# Patient Record
Sex: Female | Born: 2000 | Race: White | Hispanic: No | Marital: Single | Smoking: Never smoker
Health system: Southern US, Community
[De-identification: ages and names within clinical notes are randomized; demographics above are authoritative.]

## PROBLEM LIST (undated history)

## (undated) DIAGNOSIS — T7840XA Allergy, unspecified, initial encounter: Secondary | ICD-10-CM

## (undated) DIAGNOSIS — F988 Other specified behavioral and emotional disorders with onset usually occurring in childhood and adolescence: Secondary | ICD-10-CM

## (undated) HISTORY — DX: Other specified behavioral and emotional disorders with onset usually occurring in childhood and adolescence: F98.8

## (undated) HISTORY — PX: TYMPANOSTOMY TUBE PLACEMENT: SHX32

## (undated) HISTORY — PX: TONSILLECTOMY AND ADENOIDECTOMY: SUR1326

## (undated) HISTORY — DX: Allergy, unspecified, initial encounter: T78.40XA

## (undated) HISTORY — PX: ADENOIDECTOMY: SUR15

---
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
## 2019-02-13 ENCOUNTER — Telehealth: Payer: Self-pay | Admitting: Psychiatry

## 2019-02-13 DIAGNOSIS — F340 Cyclothymic disorder: Secondary | ICD-10-CM

## 2019-02-13 NOTE — Telephone Encounter (Signed)
Patient called and would like you to call her back. She has a question about going up on her lanictal? She will be available after tomorrow after 11 am to discuss this matter. Please call at [phone]

## 2019-02-14 MED ORDER — LAMOTRIGINE 100 MG PO TABS: 100.0000 mg | ORAL_TABLET | Freq: Every day | ORAL | 0 refills | Status: DC

## 2019-02-14 NOTE — Addendum Note (Signed)
Addended by: Milana Huntsman E on: 02/14/2019 11:57 AM   Modules accepted: Orders

## 2019-02-14 NOTE — Telephone Encounter (Signed)
Phone review with patient who has discussed with mother current status of mood, executive function, and anxiety concluding all improved in her daily life in [place] but needing to complete the titration of Lamictal now as has been anticipated including based in brother's course of response to treatment.  Lamictal 100 mg every bedtime as best for her #90 with no refill is sent to [facility] [address] #90 with no refill due for appointment early 2021 January or February.  She appears to still take the Wellbutrin but does not definitely clarify for Effexor or Dextrostat currently.

## 2019-02-21 ENCOUNTER — Other Ambulatory Visit: Payer: Self-pay | Admitting: Psychiatry

## 2019-02-21 DIAGNOSIS — F902 Attention-deficit hyperactivity disorder, combined type: Secondary | ICD-10-CM

## 2019-02-21 DIAGNOSIS — F340 Cyclothymic disorder: Secondary | ICD-10-CM

## 2019-02-21 DIAGNOSIS — F411 Generalized anxiety disorder: Secondary | ICD-10-CM

## 2019-03-07 ENCOUNTER — Other Ambulatory Visit: Payer: Self-pay | Admitting: Psychiatry

## 2019-03-07 DIAGNOSIS — F340 Cyclothymic disorder: Secondary | ICD-10-CM

## 2019-04-23 ENCOUNTER — Other Ambulatory Visit: Payer: Self-pay | Admitting: Psychiatry

## 2019-04-23 DIAGNOSIS — F902 Attention-deficit hyperactivity disorder, combined type: Secondary | ICD-10-CM

## 2019-04-23 DIAGNOSIS — F411 Generalized anxiety disorder: Secondary | ICD-10-CM

## 2019-04-23 DIAGNOSIS — F340 Cyclothymic disorder: Secondary | ICD-10-CM

## 2019-05-27 ENCOUNTER — Other Ambulatory Visit: Payer: Self-pay | Admitting: Psychiatry

## 2019-05-27 DIAGNOSIS — F902 Attention-deficit hyperactivity disorder, combined type: Secondary | ICD-10-CM

## 2019-05-28 ENCOUNTER — Other Ambulatory Visit: Payer: Self-pay | Admitting: Psychiatry

## 2019-05-28 DIAGNOSIS — F902 Attention-deficit hyperactivity disorder, combined type: Secondary | ICD-10-CM

## 2019-05-28 DIAGNOSIS — F411 Generalized anxiety disorder: Secondary | ICD-10-CM

## 2019-05-28 DIAGNOSIS — F340 Cyclothymic disorder: Secondary | ICD-10-CM

## 2019-05-28 NOTE — Telephone Encounter (Signed)
Last apt 12/2018 nothing scheduled

## 2019-05-28 NOTE — Telephone Encounter (Signed)
Last seen 12/06/2018 sending 1 additional Dextrostat 5 mg IR twice daily #60 with no refill to [facility] reminding that office appointment due.

## 2019-07-29 ENCOUNTER — Other Ambulatory Visit: Payer: Self-pay

## 2019-07-29 DIAGNOSIS — F340 Cyclothymic disorder: Secondary | ICD-10-CM

## 2019-07-29 MED ORDER — LAMOTRIGINE 100 MG PO TABS: ORAL_TABLET | ORAL | 0 refills | Status: DC

## 2019-08-07 ENCOUNTER — Ambulatory Visit: Payer: BC Managed Care – PPO | Admitting: Psychiatry

## 2019-08-12 ENCOUNTER — Ambulatory Visit (INDEPENDENT_AMBULATORY_CARE_PROVIDER_SITE_OTHER): Payer: BC Managed Care – PPO | Admitting: Psychiatry

## 2019-08-12 ENCOUNTER — Encounter: Payer: Self-pay | Admitting: Psychiatry

## 2019-08-12 ENCOUNTER — Other Ambulatory Visit: Payer: Self-pay

## 2019-08-12 VITALS — Ht 65.0 in | Wt 130.0 lb

## 2019-08-12 DIAGNOSIS — F809 Developmental disorder of speech and language, unspecified: Secondary | ICD-10-CM | POA: Diagnosis not present

## 2019-08-12 DIAGNOSIS — F902 Attention-deficit hyperactivity disorder, combined type: Secondary | ICD-10-CM | POA: Diagnosis not present

## 2019-08-12 DIAGNOSIS — F340 Cyclothymic disorder: Secondary | ICD-10-CM | POA: Diagnosis not present

## 2019-08-12 DIAGNOSIS — F411 Generalized anxiety disorder: Secondary | ICD-10-CM

## 2019-08-12 MED ORDER — ESCITALOPRAM OXALATE 5 MG PO TABS: 5.0000 mg | ORAL_TABLET | Freq: Every day | ORAL | 2 refills | Status: DC

## 2019-08-12 MED ORDER — DEXTROAMPHETAMINE SULFATE 5 MG PO TABS: 5.0000 mg | ORAL_TABLET | Freq: Two times a day (BID) | ORAL | 0 refills | Status: DC

## 2019-08-12 MED ORDER — LAMOTRIGINE 150 MG PO TABS: 150.0000 mg | ORAL_TABLET | Freq: Every day | ORAL | 2 refills | Status: DC

## 2019-08-12 MED ORDER — BUPROPION HCL ER (SR) 150 MG PO TB12: 150.0000 mg | ORAL_TABLET | Freq: Every day | ORAL | 0 refills | Status: DC

## 2019-08-12 NOTE — Progress Notes (Signed)
[facility]  Patient ID: Christy Hart,  MRN: [id number]  PCP: Doristine Bosworth, MD  Date of Evaluation: 08/12/2019 Time spent:25 minutes from 1140 to 1205  Chief Complaint:  Chief Complaint    ADHD; Anxiety; Depression; Agitation; Manic Behavior      HISTORY/CURRENT STATUS: Christy Hart is seen onsite in office 25 minutes conjointly with mother with consent with epic collateral for adolescent psychiatric interview and exam in 36-month evaluation and management being 2 months overdue for cyclothymia, generalized anxiety, and ADHD/developmental coordination disorder.  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  Patient does state that some days she does not want to leave the apartment.  Mother has been helping her with breathing techniques for anxiety, and the patient continues on line therapy session with Burney Gauze, PhD.  They have called in the interim to titrate up Lamictal rather than returning for follow-up so that Lamictal is now at 100 mg daily long with her Wellbutrin 150 mg XL daily and Dextrostat 5 mg IR twice daily.  The patient did stop Effexor though much more gradually than had been planned.  They seek additional treatment in place of the Effexor today for anxiety interested in Lexapro as the patient's brother takes that.  However at the same time they are concerned the patient's Lamictal is insufficient for mood irritability or instability.  She is preparing for school in August, so it is not possible for her to disengage from work and return home to prepare but rather seeks medication  management to potentially facilitate anxiety relief for her own self directed stabilization.  Brother had similar adaptive problems to college predominantly rooted in OCD though the patient does not acknowledge the same cognitive fixations and ritualized responses but rather generalized anxiety.  She is not manic, psychotic, delirious, or suicidal.   Depression         The patient presents withdepressionasa newproblem starting2 years with onset quality overall gradual occurring daily with labile apathetic negativity alternating with expansive social/task completion pursuit.The problem has been gradually waxing and waningsince onset.Associated symptoms include irritable agitation, constricting and expansive social projectiondecreased interest,s still on BCP, decreased concentration, concentration, decreased interest,social inhibition and worrisome doubt quickly worked through, and episodic sadness. Associated symptoms include no fatigue,no irritability,no myalgias,no headaches, no hopelessness, no self defeat or self-harm,and no suicidal ideas.The symptoms are aggravated by social issues, family issues and work stress.Past treatments include SNRI, other medications and psychotherapy.Compliance with treatment is variable and poor.Past compliance problems include medical issues, difficulty with treatment plan and medication issues.Previous treatment provided mildrelief.Risk factors include a change in medication usage/dosage, family history, family history of mental illness, history of mental illness, major life event, stress and prior traumatic experience. Past medical history includes anxiety,depressionand mental health disorder. Pertinent negatives include no life-threatening condition,no physical disability,no recent psychiatric admission,no bipolar disorder,no eating disorder,no obsessive-compulsive disorder,no post-traumatic stress disorder,no schizophrenia,no  suicide attemptsand no head trauma.  Individual Medical History/ Review of Systems: Changes? :Yes Weight is down 7 pounds in 8 months  registry documenting last dispensing of Dextrostat 05/28/2019  Allergies: Amoxicillin and Azithromycin  Current Medications:  Current Outpatient Medications:  .  buPROPion (WELLBUTRIN SR) 150 MG 12 hr tablet, Take 1 tablet (150 mg total) by mouth daily after breakfast., Disp: 90 tablet, Rfl: 0 .  dextroamphetamine (DEXTROSTAT) 5 MG tablet, Take 1 tablet (5 mg total) by mouth 2 (two) times daily with breakfast and lunch., Disp: 60 tablet, Rfl: 0 .  doxycycline (VIBRA-TABS) 100 MG tablet, Take 1 tablet (100 mg total) by mouth 2 (two) times daily., Disp: 20 tablet, Rfl: 0 .  drospirenone-ethinyl estradiol (YASMIN,ZARAH,SYEDA) 3-0.03 MG tablet, , Disp: , Rfl:  .  escitalopram (LEXAPRO) 5 MG tablet, Take 1 tablet (5 mg total) by mouth at bedtime., Disp: 30 tablet, Rfl: 2 .  fluticasone (FLONASE) 50 MCG/ACT nasal spray, Place 2 sprays into both nostrils daily., Disp: 16 g, Rfl: 12 .  lamoTRIgine (LAMICTAL) 150 MG tablet, Take 1 tablet (150 mg total) by mouth daily., Disp: 30 tablet, Rfl: 2  Medication Side Effects: none  Family Medical/ Social History: Changes? No  MENTAL HEALTH EXAM:  Height 5\' 5"  (1.651 m), weight 130 lb (59 kg).Body mass index is 21.63 kg/m. Muscle strengths and tone 5/5, postural reflexes and gait 0/0, and AIMS = 0 otherwise deferred for coronavirus shutdown  General Appearance: Casual, Fairly Groomed, Guarded and Meticulous  Eye Contact:  Fair  Speech:  Blocked, Clear and Coherent, Normal Rate and Talkative  Volume:  Normal  Mood:  Anxious, Dysphoric, Euphoric, Euthymic and Irritable  Affect:  Congruent, Depressed, Inappropriate, Labile, Full Range and Anxious  Thought Process:  Coherent, Goal Directed, Irrelevant, Linear and Descriptions of Associations: Tangential  Orientation:  Full (Time, Place, and Person)  Thought Content:  Ilusions, Rumination and Tangential   Suicidal Thoughts:  No  Homicidal Thoughts:  No  Memory:  Immediate;   Good Remote;   Fair  Judgement:  Impaired to fair  Insight:  Fair and Lacking  Psychomotor Activity:  Normal, Increased and Mannerisms  Concentration:  Concentration: Fair and Attention Span: Fair  Recall:  [employer] of Knowledge: Fair to good  Language: Good  Assets:  Intimacy Physical Health Resilience Talents/Skills  ADL's:  Intact  Cognition: WNL  Prognosis:  Fair    DIAGNOSES:    ICD-10-CM   1. Attention deficit hyperactivity disorder (ADHD), combined type, moderate  F90.2 buPROPion (WELLBUTRIN SR) 150 MG 12 hr tablet    dextroamphetamine (DEXTROSTAT) 5 MG tablet  2. 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  Patient and mother have helpless posture now in separation when in session today reconstructing the hostile dependence of prior to her move to [place] projecting that any discomfort will produce unchangeable consequences when the patient has been overcoming consequences in her work and likely soon her school to successfully reside on her own in [place].  Therefore the session must redirect to the coping skills that accomplished her moved to [place] likely in her therapy with Carney Bern more than from mother's help in which the patient becomes dependent and  needy.  By closure of session today, the patient is more competent and confident that she can meet the employment and school expectations for upcoming combination.  Lamictal is increased to 150 mg every morning sent as #30 with 2 refills to [facility] for cyclothymia as she requires it to be filled in [place] though she is going back to [place] and wishes her other prescriptions to be sent there.  She is E scribed Lexapro 5 mg every bedtime sent as #30 with [facility] [address] [place] on [address] for generalized anxiety.  She is E scribed Wellbutrin 150 mg SR tablet every morning after breakfast sent as #30 with [facility]ills [facility] [address] in [place] for cyclothymia, ADHD and generalized anxiety.  She is E scribed Dextrostat 5 mg IR twice daily breakfast and lunch sent as #60 with no refill to [facility] [address] [place] [facility] [address] for ADHD.  She returns for follow-up in 3 to 6 months advising 3 months but she refuses to nodule that is less necessary otherwise 6 months   Chauncey Mann, MD

## 2019-09-07 ENCOUNTER — Other Ambulatory Visit: Payer: Self-pay | Admitting: Psychiatry

## 2019-09-07 DIAGNOSIS — F340 Cyclothymic disorder: Secondary | ICD-10-CM

## 2019-09-07 DIAGNOSIS — F411 Generalized anxiety disorder: Secondary | ICD-10-CM

## 2019-10-28 ENCOUNTER — Other Ambulatory Visit: Payer: Self-pay

## 2019-10-28 ENCOUNTER — Telehealth: Payer: Self-pay | Admitting: Psychiatry

## 2019-10-28 DIAGNOSIS — F340 Cyclothymic disorder: Secondary | ICD-10-CM

## 2019-10-28 MED ORDER — LAMOTRIGINE 150 MG PO TABS: 150.0000 mg | ORAL_TABLET | Freq: Every day | ORAL | 2 refills | Status: DC

## 2019-10-28 NOTE — Telephone Encounter (Signed)
Pt needs Lamictal 150mg  [facility] [address] [facility] [place].  Appt 8/18

## 2019-10-28 NOTE — Telephone Encounter (Signed)
Rx refill submitted to [facility] [place].

## 2019-12-18 ENCOUNTER — Ambulatory Visit: Payer: BC Managed Care – PPO | Admitting: Psychiatry

## 2019-12-19 ENCOUNTER — Other Ambulatory Visit: Payer: Self-pay | Admitting: Psychiatry

## 2019-12-19 ENCOUNTER — Telehealth (INDEPENDENT_AMBULATORY_CARE_PROVIDER_SITE_OTHER): Payer: BC Managed Care – PPO | Admitting: Psychiatry

## 2019-12-19 ENCOUNTER — Encounter: Payer: Self-pay | Admitting: Psychiatry

## 2019-12-19 ENCOUNTER — Telehealth: Payer: Self-pay | Admitting: Psychiatry

## 2019-12-19 DIAGNOSIS — F902 Attention-deficit hyperactivity disorder, combined type: Secondary | ICD-10-CM | POA: Diagnosis not present

## 2019-12-19 DIAGNOSIS — F809 Developmental disorder of speech and language, unspecified: Secondary | ICD-10-CM

## 2019-12-19 DIAGNOSIS — F411 Generalized anxiety disorder: Secondary | ICD-10-CM

## 2019-12-19 DIAGNOSIS — F340 Cyclothymic disorder: Secondary | ICD-10-CM | POA: Diagnosis not present

## 2019-12-19 MED ORDER — LAMOTRIGINE 150 MG PO TABS: 150.0000 mg | ORAL_TABLET | Freq: Every day | ORAL | 5 refills | Status: DC

## 2019-12-19 MED ORDER — BUPROPION HCL ER (SR) 150 MG PO TB12: 150.0000 mg | ORAL_TABLET | Freq: Every day | ORAL | 5 refills | Status: DC

## 2019-12-19 NOTE — Progress Notes (Signed)
[facility]  Patient ID: Christy Hart,  MRN: [id number]  PCP: Doristine Bosworth, MD  Date of Evaluation: 12/19/2019 Time spent:20 minutes 1345 to 1405  Chief Complaint:  Chief Complaint    ADHD; Anxiety; Depression; Manic Behavior      HISTORY/CURRENT STATUS: Christy Hart is provided telemedicine audiovisual appointment session though she declines video camera due to generalized anxiety, 20 minutes phone to phone individually on virtual office visit with telehealth consent fully documented with epic collateral for psychiatric interview and exam in 58-month evaluation and management being 1 month overdue for ADHD, generalized anxiety disorder, cyclothymia, and developmental communication disorder of auditory processing.  The patient's phone number was busy for this appointment as she reports calling [employer] for flat tire and then called here to start the session as I continued to try to reach her. The patient did not return for follow-up before leaving for [employer] to start next Monday in [place] establishing her residence there fully.  She did not continue the Lexapro 5 mg from last appointment for increased anxiety nor did she assess any impact upon increased irritability for which she thought she might need increase Lamictal, both now adequate with previous medication and therapy.  Mother was placing most of their medication pursuit upon the course of older brother now finishing college.  [place] registry documents only Dextrostat dispensing being 3 days after last appointment 08/15/2019 taking none in the summer patient now stating she has more than adequate supply and declines any further  Dextrostat escriptions, possibly implying Dextrostat contributes to the complaints last appointment of irritability and anxiety.  She has no mania, suicidality, psychosis or delirium.   Depression The patient presents withcyclic depressionstartingover 2 years with onset quality  gradualoccurringdaily with labile apathetic negativity alternating with expansive social/task completion pursuit.The problem has been gradually waxing and waningsince onset.Associated symptoms include irritable agitation, constricting and expansive social projection, decreased interest,decreased concentration,decreased interest,social inhibition, excessive worrisome doubt quickly worked through, andepisodicsadness. Associated symptoms include no fatigue,no irritability,no myalgias,no headaches, nohopelessness, no self defeat or self-harm,and no suicidal ideas.The symptoms are aggravated by social issues, family issues, medications, and work stress.Past treatments includeSNRI,other medications and psychotherapy.Compliance with treatment is variable and poor.Past compliance problems include medical issues, difficulty with treatment plan and medication issues.Previous treatment provided mildrelief.Risk factors include a change in medication usage/dosage, family history, family history of mental illness, history of mental illness, major life event, stress and prior traumatic experience. Past medical history includes anxiety,depressionand mental health disorder. Pertinent negatives include no life-threatening condition,no physical disability,no recent psychiatric admission,no bipolar disorder,no eating disorder,no obsessive-compulsive disorder,no post-traumatic stress disorder,no schizophrenia,no suicide attemptsand no head trauma.  Individual Medical History/ Review of Systems: Changes? :No   Allergies: Amoxicillin and Azithromycin  Current Medications:  Current Outpatient Medications:  .  buPROPion (WELLBUTRIN SR) 150 MG 12 hr tablet, Take 1 tablet (150 mg total) by mouth daily after breakfast., Disp: 30 tablet, Rfl: 5 .  dextroamphetamine (DEXTROSTAT) 5 MG tablet, Take 1 tablet (5 mg total) by mouth 2 (two) times daily with breakfast and lunch., Disp: 60 tablet,  Rfl: 0 .  doxycycline (VIBRA-TABS) 100 MG tablet, Take 1 tablet (100 mg total) by mouth 2 (two) times daily., Disp: 20 tablet, Rfl: 0 .  drospirenone-ethinyl estradiol (YASMIN,ZARAH,SYEDA) 3-0.03 MG tablet, , Disp: , Rfl:  .  fluticasone (FLONASE) 50 MCG/ACT nasal spray, Place 2 sprays into both nostrils daily., Disp: 16 g, Rfl: 12 .  lamoTRIgine (LAMICTAL) 150 MG tablet, Take 1 tablet (150 mg  total) by mouth daily., Disp: 30 tablet, Rfl: 5  Medication Side Effects: none  Family Medical/ Social History: Changes? No  MENTAL HEALTH EXAM:  There were no vitals taken for this visit.There is no height or weight on file to calculate BMI. as not present here today  General Appearance: NA  Eye Contact:  NA  Speech:  Clear and Coherent, Normal Rate and Talkative  Volume:  Normal  Mood:  Anxious and Euthymic  Affect:  Congruent, Inappropriate, Labile and Anxious  Thought Process:  Coherent, Goal Directed, Irrelevant, Linear and Descriptions of Associations: Tangential  Orientation:  Full (Time, Place, and Person)  Thought Content: Rumination and Tangential   Suicidal Thoughts:  No  Homicidal Thoughts:  No  Memory:  Immediate;   Good Remote;   Fair  Judgement:  Fair  Insight:  Lacking  Psychomotor Activity:  NA  Concentration:  Concentration: Fair and Attention Span: Fair  Recall:  [employer] of Knowledge: Good  Language: Fair  Assets:  Intimacy Physical Health Resilience Social Support Talents/Skills  ADL's:  Intact  Cognition: WNL  Prognosis:  Fair    DIAGNOSES:    ICD-10-CM   1. Attention deficit hyperactivity disorder (ADHD), combined type, moderate  F90.2 buPROPion (WELLBUTRIN SR) 150 MG 12 hr tablet  2. 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 She plans Dextrostat 5 mg IR twice daily breakfast and lunch as needed though likely to be taken only for big projects and tests as she may have some irritability and anxiety  symptoms from the product which does help her ADHD, so she declines any eScription now stating she has more than adequate supply but can take the low-dose without problem when her other problems are most stable. Wellbutrin 150 mg SR every morning sent as #30 with [facility] [address] [facility] in [place] for cyclothymia, ADHD and generalized anxiety.  She is E scribed Lamictal 150 mg tablet every morning sent as #30 with [facility] [address] in [place] for cyclothymia.  She plans follow-up in 6 months or sooner if needed.   Virtual Visit via Telephone Note  I connected with Cheyenne Adas on 12/19/19 at  1:40 PM EDT by telephone and verified that I am speaking with the correct person using two identifiers.  Location: Patient:  phone to phone individually with privacy at [facility] residence Provider: [facility]   I discussed the limitations, risks, security and privacy concerns of performing an evaluation and management service by telephone and the availability of in person appointments. I also discussed with the patient that there may be a patient responsible charge related to this service. The patient expressed understanding and agreed to proceed.   History of Present Illness: 72-month evaluation and management being 1 month overdue address ADHD, generalized anxiety disorder, cyclothymia, and developmental communication disorder of auditory processing.     Observations/Objective: Mood:  Anxious and Euthymic  Affect:  Congruent, Inappropriate, Labile and Anxious  Thought Process:  Coherent,  Goal Directed, Irrelevant, Linear and Descriptions of Associations: Tangential  Orientation:  Full (Time, Place, and Person)  Thought Content: Rumination and Tangential    Assessment and Plan: Psychosupportive psychoeducation gains opportunity though with limited consolidation by patient of symptom treatment matching and skills from Burney Gauze, PhD. She plans Dextrostat 5 mg IR twice daily breakfast and lunch as needed though likely to be taken only for big projects and tests as she may have some irritability and anxiety  symptoms from the product which does help her ADHD, so she declines any eScription now stating she has more than adequate supply but can take the low-dose without problem when her other problems are most stable. Wellbutrin 150 mg SR every morning sent as #30 with [facility] in [place] for cyclothymia, ADHD and generalized anxiety.  She is E scribed Lamictal 150 mg tablet every morning sent as #30 with [facility] [address] in [place] for cyclothymia.   Follow Up Instructions:  She plans follow-up in 6 months or sooner if needed so having sessions with Burney Gauze, PhD    I discussed the assessment and treatment plan with the patient. The patient was provided an opportunity to ask questions and all were answered. The patient agreed with the plan and demonstrated an understanding of the instructions.   The patient was advised to call back or seek an in-person evaluation if the symptoms worsen or if the condition fails to improve as anticipated.  I provided 20 minutes of non-face-to-face time during this encounter.   Chauncey Mann, MD  Chauncey Mann, MD

## 2019-12-19 NOTE — Telephone Encounter (Signed)
[name], ulloa are scheduled for a virtual visit with your provider today.    Just as we do with appointments in the office, we must obtain your consent to participate.  Your consent will be active for this visit and any virtual visit you may have with one of our providers in the next 365 days.    If you have a MyChart account, I can also send a copy of this consent to you electronically.  All virtual visits are billed to your insurance company just like a traditional visit in the office.  As this is a virtual visit, video technology does not allow for your provider to perform a traditional examination.  This may limit your provider's ability to fully assess your condition.  If your provider identifies any concerns that need to be evaluated in person or the need to arrange testing such as labs, EKG, etc, we will make arrangements to do so.    Although advances in technology are sophisticated, we cannot ensure that it will always work on either your end or our end.  If the connection with a video visit is poor, we may have to switch to a telephone visit.  With either a video or telephone visit, we are not always able to ensure that we have a secure connection.   I need to obtain your verbal consent now.   Are you willing to proceed with your visit today?   Christy Hart has provided verbal consent on 12/19/2019 for a virtual visit (video or telephone).   Chauncey Mann, MD 12/19/2019  1:59 PM

## 2020-01-22 ENCOUNTER — Other Ambulatory Visit: Payer: Self-pay

## 2020-01-22 ENCOUNTER — Ambulatory Visit (INDEPENDENT_AMBULATORY_CARE_PROVIDER_SITE_OTHER): Payer: BC Managed Care – PPO | Admitting: Psychiatry

## 2020-01-22 VITALS — Ht 65.0 in | Wt 132.0 lb

## 2020-01-22 DIAGNOSIS — F809 Developmental disorder of speech and language, unspecified: Secondary | ICD-10-CM

## 2020-01-22 DIAGNOSIS — F411 Generalized anxiety disorder: Secondary | ICD-10-CM

## 2020-01-22 DIAGNOSIS — F340 Cyclothymic disorder: Secondary | ICD-10-CM | POA: Diagnosis not present

## 2020-01-22 DIAGNOSIS — F902 Attention-deficit hyperactivity disorder, combined type: Secondary | ICD-10-CM | POA: Diagnosis not present

## 2020-01-22 DIAGNOSIS — F4311 Post-traumatic stress disorder, acute: Secondary | ICD-10-CM | POA: Diagnosis not present

## 2020-01-22 MED ORDER — HYDROXYZINE HCL 25 MG PO TABS: 25.0000 mg | ORAL_TABLET | Freq: Three times a day (TID) | ORAL | 0 refills | Status: DC | PRN
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
## 2020-01-26 ENCOUNTER — Encounter: Payer: Self-pay | Admitting: Psychiatry

## 2020-02-19 ENCOUNTER — Encounter: Payer: Self-pay | Admitting: Psychiatry

## 2020-03-12 ENCOUNTER — Other Ambulatory Visit: Payer: Self-pay | Admitting: Psychiatry

## 2020-03-12 DIAGNOSIS — F902 Attention-deficit hyperactivity disorder, combined type: Secondary | ICD-10-CM

## 2020-03-12 NOTE — Telephone Encounter (Signed)
Last refill 04/15 Last apt [facility] 01/2020

## 2020-03-13 ENCOUNTER — Other Ambulatory Visit: Payer: Self-pay | Admitting: Psychiatry

## 2020-03-13 DIAGNOSIS — F411 Generalized anxiety disorder: Secondary | ICD-10-CM

## 2020-03-13 DIAGNOSIS — F340 Cyclothymic disorder: Secondary | ICD-10-CM

## 2020-05-16 ENCOUNTER — Other Ambulatory Visit: Payer: Self-pay

## 2020-05-16 ENCOUNTER — Encounter (HOSPITAL_COMMUNITY): Payer: Self-pay

## 2020-05-16 ENCOUNTER — Ambulatory Visit (HOSPITAL_COMMUNITY)
Admission: EM | Admit: 2020-05-16 | Discharge: 2020-05-16 | Disposition: A | Discharge status: Home / Self Care | Payer: BC Managed Care – PPO | Attending: Family Medicine | Admitting: Family Medicine

## 2020-05-16 DIAGNOSIS — H9202 Otalgia, left ear: Secondary | ICD-10-CM

## 2020-05-16 DIAGNOSIS — H6982 Other specified disorders of Eustachian tube, left ear: Secondary | ICD-10-CM

## 2020-05-16 MED ORDER — PREDNISONE 20 MG PO TABS: 40.0000 mg | ORAL_TABLET | Freq: Every day | ORAL | 0 refills | Status: DC

## 2020-05-16 NOTE — ED Triage Notes (Signed)
Patient complains of left ear pain and pressure x 3 days. Pt is having throat pain and let side facial pain r/t the ear. Pt is aox4 and ambulatory.

## 2020-05-16 NOTE — ED Provider Notes (Signed)
[facility]   [id number] 05/16/20 Arrival Time: 1027  ASSESSMENT & PLAN:  1. Left ear pain   2. Dysfunction of left eustachian tube     No sign of ear infection. Likely viral illness with eustachian tube dysfunction.  OTC symptom care as needed.  Would like trial of: Meds ordered this encounter  Medications  . predniSONE (DELTASONE) 20 MG tablet    Sig: Take 2 tablets (40 mg total) by mouth daily.    Dispense:  10 tablet    Refill:  0     Follow-up Information    Doristine Bosworth, MD.   Specialty: Internal Medicine Why: If worsening or failing to improve as anticipated. Contact information: [address] [place] [address] [phone]               Reviewed expectations re: course of current medical issues. Questions answered. Outlined signs and symptoms indicating need for more acute intervention. Understanding verbalized. After Visit Summary given.   SUBJECTIVE: History from: patient. Christy Hart is a 20 y.o. female who reports mild ST and L ear pressure/pain; x 2-3 days; afebrile. Denies: cough and difficulty breathing. Normal PO intake without n/v/d.    OBJECTIVE:  Vitals:   05/16/20 1149 05/16/20 1151  BP:  108/78  Pulse: 86   Resp: 17   Temp:  97.8 F (36.6 C)  TempSrc: Oral Oral  SpO2: 99%     General appearance: alert; no distress Eyes: PERRLA; EOMI; conjunctiva normal HENT: [place]; AT; without nasal congestion; throat with mild cobblestoning; clear fluid behind normal appearing L TM Neck: supple  Lungs: speaks full sentences without difficulty; unlabored Extremities: no edema Skin: warm and dry Neurologic: normal gait Psychological: alert and cooperative; normal mood and affect   Allergies  Allergen Reactions  . Amoxicillin Other (See Comments)    [name], woozy  . Azithromycin     Causes the pt to turn grey in color and does not act her normal self,  woozy    Past Medical History:  Diagnosis Date  . ADD (attention deficit disorder)   . Allergy    Social History   Socioeconomic History  . Marital status: Single    Spouse name: n/a  . Number of children: 0  . Years of education: Not on file  . Highest education level: Not on file  Occupational History  . Occupation: Consulting civil engineer  Tobacco Use  . Smoking status: Never Smoker  . Smokeless tobacco: Never Used  Vaping Use  . Vaping Use: Never used  Substance and Sexual Activity  . Alcohol use: No  . Drug use: No  . Sexual activity: Yes  Other Topics Concern  . Not on file  Social History Narrative   Lives with both parents and an older brother in the same house.   Social Determinants of Health   Financial Resource Strain: Not on file  Food Insecurity: Not on file  Transportation Needs: Not on file  Physical Activity: Not on file  Stress: Not on file  Social Connections: Not on file  Intimate Partner Violence: Not on file   Family History  Problem Relation Age of Onset  . Hashimoto's thyroiditis Mother   . Stroke Maternal Grandmother 72       +TOBACCO (2 ppd smoker)   Past Surgical History:  Procedure Laterality Date  . ADENOIDECTOMY    . TONSILLECTOMY AND ADENOIDECTOMY    .  TYMPANOSTOMY TUBE PLACEMENT       Mardella Layman, MD 05/16/20 1252

## 2020-06-02 ENCOUNTER — Other Ambulatory Visit: Payer: Self-pay | Admitting: Psychiatry

## 2020-06-02 DIAGNOSIS — F411 Generalized anxiety disorder: Secondary | ICD-10-CM

## 2020-06-02 DIAGNOSIS — F340 Cyclothymic disorder: Secondary | ICD-10-CM

## 2020-06-02 DIAGNOSIS — F902 Attention-deficit hyperactivity disorder, combined type: Secondary | ICD-10-CM

## 2020-06-09 ENCOUNTER — Other Ambulatory Visit: Payer: Self-pay | Admitting: Psychiatry

## 2020-06-09 DIAGNOSIS — F340 Cyclothymic disorder: Secondary | ICD-10-CM

## 2020-06-10 NOTE — Telephone Encounter (Signed)
Patient needs scheduled with new provider

## 2020-06-16 ENCOUNTER — Ambulatory Visit: Payer: BC Managed Care – PPO | Admitting: Psychiatry

## 2020-06-16 NOTE — Telephone Encounter (Signed)
Has appt on 07/30/20

## 2020-07-14 ENCOUNTER — Other Ambulatory Visit: Payer: Self-pay | Admitting: Psychiatry

## 2020-07-14 DIAGNOSIS — F340 Cyclothymic disorder: Secondary | ICD-10-CM

## 2020-07-14 NOTE — Telephone Encounter (Signed)
Check on refill °

## 2020-07-30 ENCOUNTER — Ambulatory Visit: Payer: BC Managed Care – PPO | Admitting: Psychiatry

## 2020-08-11 ENCOUNTER — Ambulatory Visit: Payer: BC Managed Care – PPO | Admitting: Behavioral Health

## 2020-08-18 ENCOUNTER — Ambulatory Visit (INDEPENDENT_AMBULATORY_CARE_PROVIDER_SITE_OTHER): Payer: BC Managed Care – PPO | Admitting: Behavioral Health

## 2020-08-18 ENCOUNTER — Encounter: Payer: Self-pay | Admitting: Behavioral Health

## 2020-08-18 ENCOUNTER — Other Ambulatory Visit: Payer: Self-pay | Admitting: Psychiatry

## 2020-08-18 ENCOUNTER — Other Ambulatory Visit: Payer: Self-pay

## 2020-08-18 VITALS — BP 109/68 | HR 96 | Ht 65.0 in | Wt 143.0 lb

## 2020-08-18 DIAGNOSIS — F340 Cyclothymic disorder: Secondary | ICD-10-CM

## 2020-08-18 DIAGNOSIS — F902 Attention-deficit hyperactivity disorder, combined type: Secondary | ICD-10-CM | POA: Diagnosis not present

## 2020-08-18 DIAGNOSIS — F411 Generalized anxiety disorder: Secondary | ICD-10-CM | POA: Diagnosis not present

## 2020-08-18 MED ORDER — LAMOTRIGINE 150 MG PO TABS: 150.0000 mg | ORAL_TABLET | Freq: Every day | ORAL | 2 refills | Status: DC

## 2020-08-18 NOTE — Progress Notes (Signed)
[facility]  Patient ID: Christy Hart,  MRN: [id number]  PCP: Doristine Bosworth, MD  Date of Evaluation: 08/18/2020 Time spent:30 minutes  Chief Complaint:  Chief Complaint    Medication Refill; Follow-up      HISTORY/CURRENT STATUS: HPI  Individual Medical History/ Review of Systems: Changes? :No   Allergies: Amoxicillin and Azithromycin  Current Medications:  Current Outpatient Medications:  .  buPROPion (WELLBUTRIN SR) 150 MG 12 hr tablet, TAKE 1 TABLET BY MOUTH DAILY AFTER BREAKFAST., Disp: 30 tablet, Rfl: 0 .  dextroamphetamine (DEXTROSTAT) 5 MG tablet, TAKE 1 TABLET TWICE A DAY WITH BREAKFAST AND LUNCH., Disp: 60 tablet, Rfl: 0 .  drospirenone-ethinyl estradiol (YASMIN,ZARAH,SYEDA) 3-0.03 MG tablet, , Disp: , Rfl:  .  hydrOXYzine (ATARAX/VISTARIL) 25 MG tablet, Take 1 tablet (25 mg total) by mouth 3 (three) times daily as needed for anxiety or nausea (sleep)., Disp: 90 tablet, Rfl: 0 .  lamoTRIgine (LAMICTAL) 150 MG tablet, Take 1 tablet (150 mg total) by mouth daily., Disp: 30 tablet, Rfl: 2 Medication Side Effects: none  Family Medical/ Social History: Changes? No  MENTAL HEALTH EXAM:  Blood pressure 109/68, pulse 96, height 5\' 5"  (1.651 m), weight 143 lb (64.9 kg).Body mass index is 23.8 kg/m.  General Appearance: Casual, Neat and Well Groomed  Eye Contact:  Good  Speech:  Clear and Coherent  Volume:  Normal  Mood:  NA  Affect:  Appropriate  Thought Process:  Coherent  Orientation:  Full (Time, Place, and Person)  Thought Content: Logical   Suicidal Thoughts:  No  Homicidal Thoughts:  No  Memory:  WNL  Judgement:  Good  Insight:  Good  Psychomotor Activity:  Normal  Concentration:  Concentration: Good  Recall:  Good  Fund of Knowledge: Good  Language: Good  Assets:  Desire for Improvement  ADL's:  Intact  Cognition: WNL  Prognosis:  Good    DIAGNOSES:    ICD-10-CM   1. 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 Attention deficit hyperactivity disorder (ADHD), combined type  F90.2   3. Generalized anxiety disorder  F41.1     Receiving Psychotherapy: No    RECOMMENDATIONS:  Continue on current medication regimen Refilled Lamotrigine w/ two refills Reinforced effects of Lamotrigine and BC Will follow up in 3 months to reassess     , NP

## 2020-08-27 ENCOUNTER — Encounter: Payer: Self-pay | Admitting: Family Medicine

## 2020-08-27 ENCOUNTER — Other Ambulatory Visit: Payer: Self-pay

## 2020-08-27 ENCOUNTER — Ambulatory Visit: Payer: BC Managed Care – PPO | Admitting: Family Medicine

## 2020-08-27 VITALS — BP 118/72 | HR 95 | Temp 98.3°F | Resp 16 | Ht 65.0 in | Wt 143.8 lb

## 2020-08-27 DIAGNOSIS — S060X0A Concussion without loss of consciousness, initial encounter: Secondary | ICD-10-CM | POA: Diagnosis not present

## 2020-08-27 DIAGNOSIS — S39012A Strain of muscle, fascia and tendon of lower back, initial encounter: Secondary | ICD-10-CM | POA: Diagnosis not present

## 2020-08-27 NOTE — Patient Instructions (Addendum)
Make sure to eat with pain medicine, and I would recommend trying naproxen twice per day to see if less nausea. Ok to continue muscle relaxer for now.  Heat or ice and gentle range of motion may also be helpful for back pain.  Continue relative rest, try to avoid electronic media. As headache improves, ok to walk or other low intensity exercise for a few minutes at a time as tolerated. Follow-up in 1 week, sooner if any worsening or new symptoms.   Return to the clinic or go to the nearest emergency room if any of your symptoms worsen or new symptoms occur.   Acute Back Pain, Adult Acute back pain is sudden and usually short-lived. It is often caused by an injury to the muscles and tissues in the back. The injury may result from:  A muscle or ligament getting overstretched or torn (strained). Ligaments are tissues that connect bones to each other. Lifting something improperly can cause a back strain.  Wear and tear (degeneration) of the spinal disks. Spinal disks are circular tissue that provide cushioning between the bones of the spine (vertebrae).  Twisting motions, such as while playing sports or doing yard work.  A hit to the back.  Arthritis. You may have a physical exam, lab tests, and imaging tests to find the cause of your pain. Acute back pain usually goes away with rest and home care. Follow these instructions at home: Managing pain, stiffness, and swelling  Treatment may include medicines for pain and inflammation that are taken by mouth or applied to the skin, prescription pain medicine, or muscle relaxants. Take over-the-counter and prescription medicines only as told by your health care provider.  Your health care provider may recommend applying ice during the first 24-48 hours after your pain starts. To do this: ? Put ice in a plastic bag. ? Place a towel between your skin and the bag. ? Leave the ice on for 20 minutes, 2-3 times a day.  If directed, apply heat to the  affected area as often as told by your health care provider. Use the heat source that your health care provider recommends, such as a moist heat pack or a heating pad. ? Place a towel between your skin and the heat source. ? Leave the heat on for 20-30 minutes. ? Remove the heat if your skin turns bright red. This is especially important if you are unable to feel pain, heat, or cold. You have a greater risk of getting burned. Activity  Do not stay in bed. Staying in bed for more than 1-2 days can delay your recovery.  Sit up and stand up straight. Avoid leaning forward when you sit or hunching over when you stand. ? If you work at a desk, sit close to it so you do not need to lean over. Keep your chin tucked in. Keep your neck drawn back, and keep your elbows bent at a 90-degree angle (right angle). ? Sit high and close to the steering wheel when you drive. Add lower back (lumbar) support to your car seat, if needed.  Take short walks on even surfaces as soon as you are able. Try to increase the length of time you walk each day.  Do not sit, drive, or stand in one place for more than 30 minutes at a time. Sitting or standing for long periods of time can put stress on your back.  Do not drive or use heavy machinery while taking prescription pain medicine.  Use proper lifting techniques. When you bend and lift, use positions that put less stress on your back: ? [place] your knees. ? Keep the load close to your body. ? Avoid twisting.  Exercise regularly as told by your health care provider. Exercising helps your back heal faster and helps prevent back injuries by keeping muscles strong and flexible.  Work with a physical therapist to make a safe exercise program, as recommended by your health care provider. Do any exercises as told by your physical therapist.   Lifestyle  Maintain a healthy weight. Extra weight puts stress on your back and makes it difficult to have good posture.  Avoid  activities or situations that make you feel anxious or stressed. Stress and anxiety increase muscle tension and can make back pain worse. Learn ways to manage anxiety and stress, such as through exercise. General instructions  Sleep on a firm mattress in a comfortable position. Try lying on your side with your knees slightly bent. If you lie on your back, put a pillow under your knees.  Follow your treatment plan as told by your health care provider. This may include: ? Cognitive or behavioral therapy. ? Acupuncture or massage therapy. ? Meditation or yoga. Contact a health care provider if:  You have pain that is not relieved with rest or medicine.  You have increasing pain going down into your legs or buttocks.  Your pain does not improve after 2 weeks.  You have pain at night.  You lose weight without trying.  You have a fever or chills. Get help right away if:  You develop new bowel or bladder control problems.  You have unusual weakness or numbness in your arms or legs.  You develop nausea or vomiting.  You develop abdominal pain.  You feel faint. Summary  Acute back pain is sudden and usually short-lived.  Use proper lifting techniques. When you bend and lift, use positions that put less stress on your back.  Take over-the-counter and prescription medicines and apply heat or ice as directed by your health care provider. This information is not intended to replace advice given to you by your health care provider. Make sure you discuss any questions you have with your health care provider. Document Revised: 01/10/2020 Document Reviewed: 01/10/2020 Elsevier Patient Education  2021 [employer]    Concussion, Adult  A concussion is a brain injury from a hard, direct hit (trauma) to your head or body. This direct hit causes your brain to quickly shake back and forth inside your skull. A concussion may also be called a mild traumatic brain injury (TBI). Healing from  this injury can take time. What are the causes? This condition is caused by:  A direct hit to your head, such as: ? Running into a player during a game. ? Being hit in a fight. ? Hitting your head on a hard surface.  A quick and sudden movement of the head or neck, such as in a car crash. What are the signs or symptoms? The signs of a concussion can be hard to notice. They may be missed by you, family members, and doctors. You may look fine on the outside but may not act or feel normal. Physical symptoms  Headaches.  Being dizzy.  Problems with body balance.  Being sensitive to light or noise.  Vomiting or feeling like you may vomit.  Being tired.  Problems seeing or hearing.  Not sleeping or eating as you used to.  Seizure. Mental  and emotional symptoms  Feeling grouchy (irritable).  Having mood changes.  Problems remembering things.  Trouble focusing your mind (concentrating), organizing, or making decisions.  Being slow to think, act, react, speak, or read.  Feeling worried or nervous (anxious).  Feeling sad (depressed). How is this treated? This condition may be treated by:  Stopping sports or activity if you are injured. If you hit your head or have signs of concussion: ? Do not return to sports or activities the same day. ? Get checked by a doctor before you return to your activities.  Resting your body and your mind.  Being watched carefully, often at home.  Medicines to help with symptoms such as: ? Headaches. ? Feeling like you may vomit. ? Problems with sleep.  Avoiding alcohol and drugs.  Being asked to go to a concussion clinic or a place to help you recover (rehabilitation center). Recovery from a concussion can take time. Return to activities only:  When you are fully healed.  When your doctor says it is safe. Avoid taking strong pain medicines (opioids) for a concussion. Follow these instructions at home: Activity  Limit  activities that need a lot of thought or focus, such as: ? Homework or work for your job. ? Watching TV. ? Using the computer or phone. ? Playing memory games and puzzles.  Rest. Rest helps your brain heal. Make sure you: ? Get plenty of sleep. Most adults should get 7-9 hours of sleep each night. ? Rest during the day. Take naps or breaks when you feel tired.  Avoid activity like exercise until your doctor says its safe. Stop any activity that makes symptoms worse.  Do not do activities that could cause a second concussion, such as riding a bike or playing sports.  Ask your doctor when you can return to your normal activities, such as school, work, sports, and driving. Your ability to react may be slower. Do not do these activities if you are dizzy. General instructions  Take over-the-counter and prescription medicines only as told by your doctor.  Do not drink alcohol until your doctor says you can.  Watch your symptoms and tell other people to do the same. Other problems can occur after a concussion. Older adults have a higher risk of serious problems.  Tell your work Production designer, theatre/television/film, teachers, Tax adviser, school counselor, coach, or Event organiser about your injury and symptoms. Tell them about what you can or cannot do.  Keep all follow-up visits as told by your doctor. This is important.   How is this prevented? It is very important that you do not get another brain injury. In rare cases, another injury can cause brain damage that will not go away, brain swelling, or death. The risk of this is greatest in the first 7-10 days after a head injury. To avoid injuries:  Stop activities that could lead to a second concussion, such as contact sports, until your doctor says it is okay.  When you return to sports or activities: ? Do not crash into other players. This is how most concussions happen. ? Follow the rules. ? Respect other players. Do not engage in violent behavior while  playing.  Get regular exercise. Do strength and balance training.  Wear a helmet that fits you well during sports, biking, or other activities.  Helmets can help protect you from serious skull and brain injuries, but they do not protect you from a concussion. Even when wearing a helmet, you should avoid being  hit in the head. Contact a doctor if:  Your symptoms do not get better.  You have new symptoms.  You have another injury. Get help right away if:  You have bad headaches or your headaches get worse.  You feel weak or numb in any part of your body.  You feel mixed up (confused).  Your balance gets worse.  You vomit often.  You feel more sleepy than normal.  You cannot speak well, or have slurred speech.  You have a seizure.  Others have trouble waking you up.  You have changes in how you act.  You have changes in how you see (vision).  You pass out (lose consciousness). These symptoms may be an emergency. Do not wait to see if the symptoms will go away. Get medical help right away. Call your local emergency services (911 in the [place] Do not drive yourself to the hospital. Summary  A concussion is a brain injury from a hard, direct hit (trauma) to your head or body.  This condition is treated with rest and careful watching of symptoms.  Ask your doctor when you can return to your normal activities, such as school, work, or driving.  Get help right away if you have a very bad headache, feel weak in any part of your body, have a seizure, have changes in how you act or see, or if you are mixed up or more sleepy than normal. This information is not intended to replace advice given to you by your health care provider. Make sure you discuss any questions you have with your health care provider. Document Revised: 02/28/2019 Document Reviewed: 02/28/2019 Elsevier Patient Education  2021 [employer]
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
## 2020-09-03 ENCOUNTER — Encounter: Payer: Self-pay | Admitting: Family Medicine

## 2020-09-03 ENCOUNTER — Other Ambulatory Visit: Payer: Self-pay

## 2020-09-03 ENCOUNTER — Ambulatory Visit: Payer: BC Managed Care – PPO | Admitting: Family Medicine

## 2020-09-03 VITALS — BP 118/76 | HR 78 | Temp 98.1°F | Resp 16 | Ht 65.0 in | Wt 141.6 lb

## 2020-09-03 DIAGNOSIS — S39012D Strain of muscle, fascia and tendon of lower back, subsequent encounter: Secondary | ICD-10-CM

## 2020-09-03 DIAGNOSIS — S060X0D Concussion without loss of consciousness, subsequent encounter: Secondary | ICD-10-CM

## 2020-09-03 DIAGNOSIS — S39012A Strain of muscle, fascia and tendon of lower back, initial encounter: Secondary | ICD-10-CM

## 2020-09-03 DIAGNOSIS — S060X0A Concussion without loss of consciousness, initial encounter: Secondary | ICD-10-CM

## 2020-09-03 NOTE — Patient Instructions (Signed)
No restrictions needed at this point but if any return of headache or dizziness, decrease activity and follow-up if headache symptoms/concussion symptoms return.  I expect the back pain also continue to improve.  Follow-up if that soreness is not improving/resolving in the next few weeks.  Sooner if worse.
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
## 2020-11-17 ENCOUNTER — Telehealth (INDEPENDENT_AMBULATORY_CARE_PROVIDER_SITE_OTHER): Payer: BC Managed Care – PPO | Admitting: Behavioral Health

## 2020-11-17 ENCOUNTER — Encounter: Payer: Self-pay | Admitting: Behavioral Health

## 2020-11-17 DIAGNOSIS — F340 Cyclothymic disorder: Secondary | ICD-10-CM

## 2020-11-17 DIAGNOSIS — F4311 Post-traumatic stress disorder, acute: Secondary | ICD-10-CM

## 2020-11-17 DIAGNOSIS — F902 Attention-deficit hyperactivity disorder, combined type: Secondary | ICD-10-CM

## 2020-11-17 DIAGNOSIS — F411 Generalized anxiety disorder: Secondary | ICD-10-CM

## 2020-11-17 MED ORDER — DEXTROAMPHETAMINE SULFATE 5 MG PO TABS: ORAL_TABLET | ORAL | 0 refills | Status: DC

## 2020-11-17 MED ORDER — BUPROPION HCL ER (SR) 150 MG PO TB12: ORAL_TABLET | ORAL | 2 refills | Status: DC

## 2020-11-17 NOTE — Progress Notes (Signed)
EVANGELYNN LOCHRIDGE [id number] [birth year] 21 y.o.  Virtual Visit via Telephone Note  I connected with pt on 11/17/20 at  1:00 PM EDT by telephone and verified that I am speaking with the correct person using two identifiers.   I discussed the limitations, risks, security and privacy concerns of performing an evaluation and management service by telephone and the availability of in person appointments. I also discussed with the patient that there may be a patient responsible charge related to this service. The patient expressed understanding and agreed to proceed.   I discussed the assessment and treatment plan with the patient. The patient was provided an opportunity to ask questions and all were answered. The patient agreed with the plan and demonstrated an understanding of the instructions.   The patient was advised to call back or seek an in-person evaluation if the symptoms worsen or if the condition fails to improve as anticipated.  I provided 21 minutes of non-face-to-face time during this encounter.  The patient was located at home.  The provider was located at [facility].   Joan Flores, NP   Subjective:   Patient ID:  Christy Hart is a 20 y.o. (DOB [birth year]) female.  Chief Complaint:  Chief Complaint  Patient presents with   ADHD   Anxiety   Depression   Follow-up   Medication Refill    HPI Christy Hart presents for follow-up and medication refills.  She says that she is doing very well right now but preparing for school to start back up. She is currently back in [facility]. Says she was not able to make regular in person visit.  She does not want to change her medications at this time and she feels stable. Says her anxiety is 2/10 and depression is 2/10. She is sleeping 7-8 hours per night.  She says that she has not been taking her Dextrostat  because she has not needed it much being out of school. Says that she will need the medication again  since starting back. She take medication appropriately and sparingly. She denies mania, no psychosis. No SI/HI.  No Prior psychiatric medication failures  Review of Systems:  Review of Systems  Constitutional: Negative.   Allergic/Immunologic: Negative.   Neurological: Negative.   Psychiatric/Behavioral:  The patient is nervous/anxious.    Medications: I have reviewed the patient's current medications.  Current Outpatient Medications  Medication Sig Dispense Refill   buPROPion (WELLBUTRIN SR) 150 MG 12 hr tablet TAKE 1 TABLET BY MOUTH DAILY AFTER BREAKFAST. 30 tablet 2   dextroamphetamine (DEXTROSTAT) 5 MG tablet TAKE 1 TABLET TWICE A DAY WITH BREAKFAST AND LUNCH. 60 tablet 0   drospirenone-ethinyl estradiol (YASMIN,ZARAH,SYEDA) 3-0.03 MG tablet      hydrOXYzine (ATARAX/VISTARIL) 25 MG tablet Take 1 tablet (25 mg total) by mouth 3 (three) times daily as needed for anxiety or nausea (sleep). 90 tablet 0   lamoTRIgine (LAMICTAL) 150 MG tablet Take 1 tablet (150 mg total) by mouth daily. 30 tablet 2   No current facility-administered medications for this visit.    Medication Side Effects: None  Allergies:  Allergies  Allergen Reactions   Amoxicillin Other (See Comments)    Turns chalk Zanyah Lentsch and does not act normal self, woozy   Azithromycin     Causes the pt to turn grey in color and does not act her normal self, woozy   Ciprofloxacin     Unsure reaction     Past Medical History:  Diagnosis  Date   ADD (attention deficit disorder)    Allergy     Family History  Problem Relation Age of Onset   Hashimoto's thyroiditis Mother    Stroke Maternal Grandmother 22       +TOBACCO (2 ppd smoker)    Social History   Socioeconomic History   Marital status: Single    Spouse name: n/a   Number of children: 0   Years of education: 12   Highest education level: High school graduate  Occupational History   Occupation: Waitress  Tobacco Use   Smoking status: Never   Smokeless  tobacco: Never  Vaping Use   Vaping Use: Never used  Substance and Sexual Activity   Alcohol use: No   Drug use: No   Sexual activity: Yes    Partners: Male    Birth control/protection: Pill  Other Topics Concern   Not on file  Social History Narrative   Lives with both parents and an older brother in the same house.   Social Determinants of Health   Financial Resource Strain: Not on file  Food Insecurity: Not on file  Transportation Needs: Not on file  Physical Activity: Not on file  Stress: Not on file  Social Connections: Not on file  Intimate Partner Violence: Not on file    Past Medical History, Surgical history, Social history, and Family history were reviewed and updated as appropriate.   Please see review of systems for further details on the patient's review from today.   Objective:   Physical Exam:  There were no vitals taken for this visit.  Physical Exam Neurological:     Mental Status: She is alert and oriented to person, place, and time.  Psychiatric:        Attention and Perception: Attention and perception normal.        Mood and Affect: Mood normal.        Speech: Speech normal.        Behavior: Behavior normal. Behavior is cooperative.        Cognition and Memory: Cognition and memory normal.        Judgment: Judgment normal.     Comments: Insight intact    Lab Review:     Component Value Date/Time   NA 136 01/31/2017 1004   K 4.4 01/31/2017 1004   CL 99 01/31/2017 1004   CO2 24 01/31/2017 1004   GLUCOSE 81 01/31/2017 1004   GLUCOSE 65 08/31/2015 1920   BUN 9 01/31/2017 1004   CREATININE 0.94 01/31/2017 1004   CREATININE 0.65 08/31/2015 1920   CALCIUM 9.7 01/31/2017 1004   PROT 6.9 01/31/2017 1004   ALBUMIN 4.9 01/31/2017 1004   AST 15 01/31/2017 1004   ALT 7 01/31/2017 1004   ALKPHOS 55 01/31/2017 1004   BILITOT 0.3 01/31/2017 1004   GFRNONAA CANCELED 01/31/2017 1004   GFRAA CANCELED 01/31/2017 1004       Component Value  Date/Time   WBC 8.1 01/22/2018 1437   WBC 7.5 12/29/2014 0417   RBC 4.30 01/22/2018 1437   RBC 3.96 12/29/2014 0417   HGB 12.6 01/22/2018 1437   HGB 13.8 01/31/2017 1004   HCT 36.4 (A) 01/22/2018 1437   HCT 40.7 01/31/2017 1004   PLT 306 01/31/2017 1004   MCV 84.6 01/22/2018 1437   MCV 89 01/31/2017 1004   MCH 29.4 01/22/2018 1437   MCH 29.3 12/29/2014 0417   MCHC 34.7 01/22/2018 1437   MCHC 33.6 12/29/2014 0417   RDW  12.9 01/31/2017 1004   LYMPHSABS 2.0 01/31/2017 1004   MONOABS 0.9 08/11/2012 1630   EOSABS 0.0 01/31/2017 1004   BASOSABS 0.0 01/31/2017 1004    No results found for: POCLITH, LITHIUM   No results found for: PHENYTOIN, PHENOBARB, VALPROATE, CBMZ   .res Assessment: Plan:    Christy Hart was seen today for adhd, anxiety, depression, follow-up and medication refill.  Diagnoses and all orders for this visit:  Attention deficit hyperactivity disorder (ADHD), combined type  Generalized anxiety disorder -     buPROPion (WELLBUTRIN SR) 150 MG 12 hr tablet; TAKE 1 TABLET BY MOUTH DAILY AFTER BREAKFAST.  Cyclothymic disorder in remission  Acute posttraumatic stress disorder  Attention deficit hyperactivity disorder (ADHD), combined type, moderate -     buPROPion (WELLBUTRIN SR) 150 MG 12 hr tablet; TAKE 1 TABLET BY MOUTH DAILY AFTER BREAKFAST. -     dextroamphetamine (DEXTROSTAT) 5 MG tablet; TAKE 1 TABLET TWICE A DAY WITH BREAKFAST AND LUNCH.  Cyclothymic disorder -     buPROPion (WELLBUTRIN SR) 150 MG 12 hr tablet; TAKE 1 TABLET BY MOUTH DAILY AFTER BREAKFAST.   Please see After Visit Summary for patient specific instructions.  RECOMMENDATIONS:   Restart Dextrostat 5 mg tablet twice daily Continue Wellbutrin 150 mg SR daily Continue Lamotrigine 150 mg daily Reinforced effects of Lamotrigine and BC Will follow up in 3 months to reassess Greater than 50% of  20 min face to face time with patient was spent on counseling and coordination of care. We discussed  her recent improvement with anxiety and depression. Discussed her need to restart stimulants due to pending new school year. She has agreed to every 3 month med check. Agrees to not share or divert medication. PDMP reviewed    No future appointments.  No orders of the defined types were placed in this encounter.     -------------------------------

## 2020-12-20 ENCOUNTER — Other Ambulatory Visit: Payer: Self-pay | Admitting: Behavioral Health

## 2020-12-20 DIAGNOSIS — F340 Cyclothymic disorder: Secondary | ICD-10-CM

## 2021-02-05 ENCOUNTER — Telehealth (INDEPENDENT_AMBULATORY_CARE_PROVIDER_SITE_OTHER): Payer: BC Managed Care – PPO | Admitting: Family Medicine

## 2021-02-05 ENCOUNTER — Telehealth: Payer: BC Managed Care – PPO | Admitting: Registered Nurse

## 2021-02-05 ENCOUNTER — Encounter: Payer: Self-pay | Admitting: Family Medicine

## 2021-02-05 DIAGNOSIS — R0981 Nasal congestion: Secondary | ICD-10-CM

## 2021-02-05 DIAGNOSIS — R0982 Postnasal drip: Secondary | ICD-10-CM | POA: Diagnosis not present

## 2021-02-05 DIAGNOSIS — R052 Subacute cough: Secondary | ICD-10-CM

## 2021-02-05 NOTE — Progress Notes (Addendum)
Virtual Visit via Video Note  I connected with Christy Hart on 02/05/21 at  4:00 PM EDT by a video enabled telemedicine application 2/2 COVID-19 pandemic and verified that I am speaking with the correct person using two identifiers.  Location patient: In car Location provider:work or home office Persons participating in the virtual visit: patient, provider  I discussed the limitations of evaluation and management by telemedicine and the availability of in person appointments. The patient expressed understanding and agreed to proceed.  Chief Complaint  Patient presents with   Cough    Cough, congestion, sore throat for 12 days went to [facility] but was not given any medication. Has been taking OTC day and night medicines, advil cold and sinus, all help temporarily.    HPI: Pt is a 20 yo female seen for acute concern.  Pt developed sore throat, ear pain, and nasal congestion 12 days ago.  Symptoms improved but cough and nasal congestion continued.  Seen at [facility] on 02/02/21.  CXR, UHcg, and rapid COVID test were negative. Given brompheniramine-pseudoephedrine-DM cough syrup but it made her dizzy.  Took OTC cough/cold meds and Afrin.  Used Nettie pot yesterday which helped.  Can feel drainage in back of throat.  Denies fever, chills, nausea, vomiting, headache, pressure/pain in face.   ROS: See pertinent positives and negatives per HPI.  Past Medical History:  Diagnosis Date   ADD (attention deficit disorder)    Allergy     Past Surgical History:  Procedure Laterality Date   ADENOIDECTOMY     TONSILLECTOMY AND ADENOIDECTOMY     TYMPANOSTOMY TUBE PLACEMENT      Family History  Problem Relation Age of Onset   Hashimoto's thyroiditis Mother    Stroke Maternal Grandmother 93       +TOBACCO (2 ppd smoker)   Current Outpatient Medications:    buPROPion (WELLBUTRIN SR) 150 MG 12 hr tablet, TAKE 1 TABLET BY MOUTH DAILY AFTER BREAKFAST., Disp: 30 tablet, Rfl: 2   dextroamphetamine  (DEXTROSTAT) 5 MG tablet, TAKE 1 TABLET TWICE A DAY WITH BREAKFAST AND LUNCH., Disp: 60 tablet, Rfl: 0   drospirenone-ethinyl estradiol (YASMIN,ZARAH,SYEDA) 3-0.03 MG tablet, , Disp: , Rfl:    hydrOXYzine (ATARAX/VISTARIL) 25 MG tablet, Take 1 tablet (25 mg total) by mouth 3 (three) times daily as needed for anxiety or nausea (sleep)., Disp: 90 tablet, Rfl: 0   lamoTRIgine (LAMICTAL) 150 MG tablet, TAKE 1 TABLET(150 MG) BY MOUTH DAILY, Disp: 30 tablet, Rfl: 2   valACYclovir (VALTREX) 500 MG tablet, Take by mouth., Disp: , Rfl:   EXAM:  VITALS per patient if applicable: RR between 12-20 bpm  GENERAL: alert, oriented, appears well and in no acute distress  HEENT: atraumatic, conjunctiva clear, no obvious abnormalities on inspection of external nose and ears  NECK: normal movements of the head and neck  LUNGS: Intermittent cough.  On inspection no signs of respiratory distress, breathing rate appears normal, no obvious gross SOB, gasping or wheezing  CV: no obvious cyanosis  MS: moves all visible extremities without noticeable abnormality  PSYCH/NEURO: pleasant and cooperative, no obvious depression or anxiety, speech and thought processing grossly intact  ASSESSMENT AND PLAN:  Discussed the following assessment and plan:  Post-nasal drainage  Subacute cough  Nasal congestion  Discussed initial cause of symptoms likely 2/2 viral illness.  An allergic rhinosinusitis component may be contributing to postnasal drainage and cough.  Discussed using OTC antihistamines such as Zyrtec, Allegra, or Claritin.  Can also continue using  Nettie pot.  Consider Flonase in addition to the above.  Reassuring that chest x-ray from 02/02/2021 negative for acute process.    Follow-up next week with pcp for continued or worsened symptoms.   I discussed the assessment and treatment plan with the patient. The patient was provided an opportunity to ask questions and all were answered. The patient agreed  with the plan and demonstrated an understanding of the instructions.   The patient was advised to call back or seek an in-person evaluation if the symptoms worsen or if the condition fails to improve as anticipated.  Deeann Saint, MD

## 2021-04-09 ENCOUNTER — Other Ambulatory Visit: Payer: Self-pay | Admitting: Psychiatry

## 2021-04-09 DIAGNOSIS — F411 Generalized anxiety disorder: Secondary | ICD-10-CM

## 2021-04-09 DIAGNOSIS — F340 Cyclothymic disorder: Secondary | ICD-10-CM

## 2021-04-09 DIAGNOSIS — F902 Attention-deficit hyperactivity disorder, combined type: Secondary | ICD-10-CM

## 2021-05-13 ENCOUNTER — Other Ambulatory Visit: Payer: Self-pay | Admitting: Behavioral Health

## 2021-05-13 DIAGNOSIS — F340 Cyclothymic disorder: Secondary | ICD-10-CM

## 2021-05-13 NOTE — Telephone Encounter (Signed)
Pt needs to get scheduled. She was due back in October for 3 month f/up

## 2021-05-14 NOTE — Telephone Encounter (Signed)
Scheduled for 2-8

## 2021-06-09 ENCOUNTER — Other Ambulatory Visit: Payer: Self-pay

## 2021-06-09 ENCOUNTER — Ambulatory Visit (INDEPENDENT_AMBULATORY_CARE_PROVIDER_SITE_OTHER): Payer: BC Managed Care – PPO | Admitting: Behavioral Health

## 2021-06-09 ENCOUNTER — Encounter: Payer: Self-pay | Admitting: Behavioral Health

## 2021-06-09 DIAGNOSIS — F4311 Post-traumatic stress disorder, acute: Secondary | ICD-10-CM | POA: Diagnosis not present

## 2021-06-09 DIAGNOSIS — F902 Attention-deficit hyperactivity disorder, combined type: Secondary | ICD-10-CM

## 2021-06-09 DIAGNOSIS — F340 Cyclothymic disorder: Secondary | ICD-10-CM

## 2021-06-09 DIAGNOSIS — F411 Generalized anxiety disorder: Secondary | ICD-10-CM | POA: Diagnosis not present

## 2021-06-09 MED ORDER — LAMOTRIGINE 200 MG PO TABS: 200.0000 mg | ORAL_TABLET | Freq: Every day | ORAL | 3 refills | Status: DC

## 2021-06-09 MED ORDER — HYDROXYZINE HCL 25 MG PO TABS: 25.0000 mg | ORAL_TABLET | Freq: Three times a day (TID) | ORAL | 2 refills | Status: DC | PRN

## 2021-06-09 MED ORDER — DEXTROAMPHETAMINE SULFATE 5 MG PO TABS: ORAL_TABLET | ORAL | 0 refills | Status: DC

## 2021-06-09 NOTE — Progress Notes (Signed)
[facility]  Patient ID: Christy Hart,  MRN: [id number]  PCP: Shade Flood, MD  Date of Evaluation: 06/09/2021 Time spent:30 minutes  Chief Complaint:  Chief Complaint   Anxiety; Depression; ADHD; Follow-up; Medication Refill     HISTORY/CURRENT STATUS: HPI  Christy Hart presents for follow-up and medication refills.   She is currently back in [facility]. She has moved a couple time recently due to moving around with boyfriend. Says they broke up because she found out he was cheating on her. She is now back in [place] living alone. She says she stopped taking Wellbutrin on her own and is actually feeling better. She would like to keep all of her other medications. She is requesting an increase in her Lamictal because she feel her biggest problem is her mood fluctuations. Says Lamictal helps more than anything.  Says her anxiety is 2/10 and depression is 2/10. She is sleeping 7-8 hours per night.  She says that she has not been taking her Dextrostat  because she has not needed it much being out of school. She take medication appropriately and sparingly. She denies mania, no psychosis. No SI/HI.   No Prior psychiatric medication failures   Individual Medical History/ Review of Systems: Changes? :No   Allergies: Amoxicillin, Azithromycin, and Ciprofloxacin  Current Medications:  Current Outpatient Medications:    lamoTRIgine (LAMICTAL) 200 MG tablet, Take 1 tablet (200 mg total) by mouth daily., Disp: 30 tablet, Rfl: 3   buPROPion (WELLBUTRIN SR) 150 MG 12 hr tablet, TAKE 1 TABLET BY MOUTH DAILY AFTER BREAKFAST, Disp: 30 tablet, Rfl: 2   dextroamphetamine (DEXTROSTAT) 5 MG tablet, TAKE 1 TABLET TWICE A DAY WITH BREAKFAST., Disp: 30 tablet, Rfl: 0   drospirenone-ethinyl estradiol (YASMIN,ZARAH,SYEDA) 3-0.03 MG tablet, , Disp: , Rfl:    hydrOXYzine (ATARAX) 25 MG tablet, Take 1 tablet (25 mg total) by mouth 3 (three) times daily as needed for anxiety or  nausea (sleep)., Disp: 90 tablet, Rfl: 2   lamoTRIgine (LAMICTAL) 150 MG tablet, TAKE ONE TABLET BY MOUTH ONCE DAILY, Disp: 30 tablet, Rfl: 1   valACYclovir (VALTREX) 500 MG tablet, Take by mouth., Disp: , Rfl:  Medication Side Effects: none  Family Medical/ Social History: Changes? No  MENTAL HEALTH EXAM:  There were no vitals taken for this visit.There is no height or weight on file to calculate BMI.  General Appearance: Casual, Neat, and Well Groomed  Eye Contact:  Good  Speech:  Clear and Coherent  Volume:  Normal  Mood:  NA  Affect:  Appropriate  Thought Process:  Coherent  Orientation:  Full (Time, Place, and Person)  Thought Content: Logical   Suicidal Thoughts:  No  Homicidal Thoughts:  No  Memory:  WNL  Judgement:  Good  Insight:  Good  Psychomotor Activity:  Normal  Concentration:  Concentration: Good  Recall:  Good  Fund of Knowledge: Good  Language: Good  Assets:  Desire for Improvement  ADL's:  Intact  Cognition: WNL  Prognosis:  Good    DIAGNOSES:    ICD-10-CM   1. Attention deficit hyperactivity disorder (ADHD), combined type  F90.2     2. Generalized anxiety disorder  F41.1 lamoTRIgine (LAMICTAL) 200 MG tablet    3. Cyclothymic disorder in remission  F34.0 lamoTRIgine (LAMICTAL) 200 MG tablet    4. Acute posttraumatic stress disorder  F43.11 lamoTRIgine (LAMICTAL) 200 MG tablet    hydrOXYzine (ATARAX) 25 MG tablet    5. Attention deficit hyperactivity disorder (  ADHD), combined type, moderate  F90.2 dextroamphetamine (DEXTROSTAT) 5 MG tablet      Receiving Psychotherapy: No    RECOMMENDATIONS:   Greater than 50% of  30 min face to face time with patient was spent on counseling and coordination of care. We discussed her current level of stability since last visit. She says that she stopped Wellbutrin and feels better. She is continuing all other medications. Her depression and anxiety levels continue to be well controlled.  Continue on current  medication regimen Increased Lamotrigine to 200 mg daily Reduce Dextrostat 5 mg tablet to once daily Continue Hydroxyzine 25 mg tablet three times daily as needed To follow up in 3 months to reasess Reviewed PDMP Reinforced effects of Lamotrigine and may reduce reliability of BC. She is currently on oral BC.         Joan Flores, NP

## 2021-09-08 ENCOUNTER — Ambulatory Visit: Payer: BC Managed Care – PPO | Admitting: Behavioral Health

## 2021-10-14 ENCOUNTER — Other Ambulatory Visit: Payer: Self-pay | Admitting: Behavioral Health

## 2021-10-14 DIAGNOSIS — F411 Generalized anxiety disorder: Secondary | ICD-10-CM

## 2021-10-14 DIAGNOSIS — F340 Cyclothymic disorder: Secondary | ICD-10-CM

## 2021-10-14 DIAGNOSIS — F4311 Post-traumatic stress disorder, acute: Secondary | ICD-10-CM

## 2021-10-15 NOTE — Telephone Encounter (Signed)
Schedule follow up with Arlys John

## 2021-10-18 NOTE — Telephone Encounter (Signed)
Lvm for pt to call and schedule

## 2021-11-27 ENCOUNTER — Telehealth: Payer: Self-pay | Admitting: Behavioral Health

## 2021-11-27 DIAGNOSIS — F411 Generalized anxiety disorder: Secondary | ICD-10-CM

## 2021-11-27 DIAGNOSIS — F340 Cyclothymic disorder: Secondary | ICD-10-CM

## 2021-11-27 DIAGNOSIS — F4311 Post-traumatic stress disorder, acute: Secondary | ICD-10-CM

## 2021-11-29 NOTE — Telephone Encounter (Signed)
Please schedule appt

## 2021-11-30 NOTE — Telephone Encounter (Signed)
Voice mail is full  

## 2021-12-03 NOTE — Telephone Encounter (Signed)
Called today and was able to leave voice mail that she needs an appt to get her meds.

## 2021-12-08 ENCOUNTER — Encounter: Payer: Self-pay | Admitting: Behavioral Health

## 2021-12-08 ENCOUNTER — Telehealth: Payer: BC Managed Care – PPO | Admitting: Behavioral Health

## 2021-12-08 DIAGNOSIS — F411 Generalized anxiety disorder: Secondary | ICD-10-CM | POA: Diagnosis not present

## 2021-12-08 DIAGNOSIS — F340 Cyclothymic disorder: Secondary | ICD-10-CM

## 2021-12-08 DIAGNOSIS — F4311 Post-traumatic stress disorder, acute: Secondary | ICD-10-CM

## 2021-12-08 MED ORDER — LAMOTRIGINE 200 MG PO TABS: 200.0000 mg | ORAL_TABLET | Freq: Every day | ORAL | 3 refills | Status: DC

## 2021-12-08 MED ORDER — HYDROXYZINE HCL 25 MG PO TABS: 25.0000 mg | ORAL_TABLET | Freq: Three times a day (TID) | ORAL | 3 refills | Status: DC | PRN

## 2021-12-08 NOTE — Progress Notes (Signed)
BLAINE GUIFFRE [id number] 02/27/2001 21 y.o.  Virtual Visit via Video Note  I connected with pt @ on 12/08/21 at 11:00 AM EDT by a video enabled telemedicine application and verified that I am speaking with the correct person using two identifiers.   I discussed the limitations of evaluation and management by telemedicine and the availability of in person appointments. The patient expressed understanding and agreed to proceed.  I discussed the assessment and treatment plan with the patient. The patient was provided an opportunity to ask questions and all were answered. The patient agreed with the plan and demonstrated an understanding of the instructions.   The patient was advised to call back or seek an in-person evaluation if the symptoms worsen or if the condition fails to improve as anticipated.  I provided 20  minutes of non-face-to-face time during this encounter.  The patient was located at home.  The provider was located at [facility].   Joan Flores, NP   Subjective:   Patient ID:  Christy Hart is a 21 y.o. (DOB 2000-06-01) female.  Chief Complaint: No chief complaint on file.   HPI   Christy Hart presents via video visit for follow-up and medication refills.   She is currently back in [facility]. No changes this visit. She says she stopped taking Wellbutrin on her own and is actually feeling better. She would like to keep all of her other medications. She is requesting an increase in her Lamictal because she feel her biggest problem is her mood fluctuations. Says Lamictal helps more than anything.  Says her anxiety is 2/10 and depression is 2/10. She is sleeping 7-8 hours per night.  She says that she has not been taking her Dextrostat  because she has not needed it much being out of school. She take medication appropriately and sparingly. She denies mania, no psychosis. No SI/HI.   Review of Systems:  Review of Systems  Constitutional: Negative.    Allergic/Immunologic: Negative.   Neurological: Negative.   Psychiatric/Behavioral:  Positive for dysphoric mood. The patient is nervous/anxious.     Medications: I have reviewed the patient's current medications.  Current Outpatient Medications  Medication Sig Dispense Refill   buPROPion (WELLBUTRIN SR) 150 MG 12 hr tablet TAKE 1 TABLET BY MOUTH DAILY AFTER BREAKFAST 30 tablet 2   dextroamphetamine (DEXTROSTAT) 5 MG tablet TAKE 1 TABLET TWICE A DAY WITH BREAKFAST. 30 tablet 0   drospirenone-ethinyl estradiol (YASMIN,ZARAH,SYEDA) 3-0.03 MG tablet      hydrOXYzine (ATARAX) 25 MG tablet Take 1 tablet (25 mg total) by mouth 3 (three) times daily as needed for anxiety or nausea (sleep). 90 tablet 3   lamoTRIgine (LAMICTAL) 150 MG tablet TAKE ONE TABLET BY MOUTH ONCE DAILY 30 tablet 1   lamoTRIgine (LAMICTAL) 200 MG tablet Take 1 tablet (200 mg total) by mouth daily. 30 tablet 3   valACYclovir (VALTREX) 500 MG tablet Take by mouth.     No current facility-administered medications for this visit.    Medication Side Effects: None  Allergies:  Allergies  Allergen Reactions   Amoxicillin Other (See Comments)    Turns chalk Iraida Cragin and does not act normal self, woozy   Azithromycin     Causes the pt to turn grey in color and does not act her normal self, woozy   Ciprofloxacin     Unsure reaction     Past Medical History:  Diagnosis Date   ADD (attention deficit disorder)    Allergy  Family History  Problem Relation Age of Onset   Hashimoto's thyroiditis Mother    Stroke Maternal Grandmother 60       +TOBACCO (2 ppd smoker)    Social History   Socioeconomic History   Marital status: Single    Spouse name: n/a   Number of children: 0   Years of education: 12   Highest education level: High school graduate  Occupational History   Occupation: Waitress  Tobacco Use   Smoking status: Never   Smokeless tobacco: Never  Vaping Use   Vaping Use: Never used  Substance and  Sexual Activity   Alcohol use: No   Drug use: No   Sexual activity: Yes    Partners: Male    Birth control/protection: Pill  Other Topics Concern   Not on file  Social History Narrative   Lives with both parents and an older brother in the same house.   Social Determinants of Health   Financial Resource Strain: Not on file  Food Insecurity: Not on file  Transportation Needs: Not on file  Physical Activity: Not on file  Stress: Not on file  Social Connections: Not on file  Intimate Partner Violence: Not on file    Past Medical History, Surgical history, Social history, and Family history were reviewed and updated as appropriate.   Please see review of systems for further details on the patient's review from today.   Objective:   Physical Exam:  There were no vitals taken for this visit.  Physical Exam Psychiatric:        Attention and Perception: Attention and perception normal.        Mood and Affect: Mood and affect normal.        Speech: Speech normal.        Behavior: Behavior normal. Behavior is cooperative.        Cognition and Memory: Cognition and memory normal.        Judgment: Judgment normal.     Lab Review:     Component Value Date/Time   NA 136 01/31/2017 1004   K 4.4 01/31/2017 1004   CL 99 01/31/2017 1004   CO2 24 01/31/2017 1004   GLUCOSE 81 01/31/2017 1004   GLUCOSE 65 08/31/2015 1920   BUN 9 01/31/2017 1004   CREATININE 0.94 01/31/2017 1004   CREATININE 0.65 08/31/2015 1920   CALCIUM 9.7 01/31/2017 1004   PROT 6.9 01/31/2017 1004   ALBUMIN 4.9 01/31/2017 1004   AST 15 01/31/2017 1004   ALT 7 01/31/2017 1004   ALKPHOS 55 01/31/2017 1004   BILITOT 0.3 01/31/2017 1004   GFRNONAA CANCELED 01/31/2017 1004   GFRAA CANCELED 01/31/2017 1004       Component Value Date/Time   WBC 8.1 01/22/2018 1437   WBC 7.5 12/29/2014 0417   RBC 4.30 01/22/2018 1437   RBC 3.96 12/29/2014 0417   HGB 12.6 01/22/2018 1437   HGB 13.8 01/31/2017 1004   HCT  36.4 (A) 01/22/2018 1437   HCT 40.7 01/31/2017 1004   PLT 306 01/31/2017 1004   MCV 84.6 01/22/2018 1437   MCV 89 01/31/2017 1004   MCH 29.4 01/22/2018 1437   MCH 29.3 12/29/2014 0417   MCHC 34.7 01/22/2018 1437   MCHC 33.6 12/29/2014 0417   RDW 12.9 01/31/2017 1004   LYMPHSABS 2.0 01/31/2017 1004   MONOABS 0.9 08/11/2012 1630   EOSABS 0.0 01/31/2017 1004   BASOSABS 0.0 01/31/2017 1004    No results found for: "POCLITH", "LITHIUM"  No results found for: "PHENYTOIN", "PHENOBARB", "VALPROATE", "CBMZ"   .res Assessment: Plan:    RECOMMENDATIONS:    Greater than 50% of 30 min face to face time with patient was spent on counseling and coordination of care. We discussed her current level of stability since last visit. She says that she stopped Wellbutrin and feels better. She is continuing all other medications. Her depression and anxiety levels continue to be well controlled.   Continue on current medication regimen Continue  Lamotrigine to 200 mg daily Reduce Dextrostat 5 mg tablet to once daily Continue Hydroxyzine 25 mg tablet three times daily as needed To follow up in 3 months to reasess Reviewed PDMP Reinforced effects of Lamotrigine and may reduce reliability of BC. She is currently on oral BC. [facility] last month. She is taking last oral BC dose and will start period.              Joan Flores, NP             Diagnoses and all orders for this visit:  Acute posttraumatic stress disorder -     hydrOXYzine (ATARAX) 25 MG tablet; Take 1 tablet (25 mg total) by mouth 3 (three) times daily as needed for anxiety or nausea (sleep). -     lamoTRIgine (LAMICTAL) 200 MG tablet; Take 1 tablet (200 mg total) by mouth daily.  Generalized anxiety disorder -     lamoTRIgine (LAMICTAL) 200 MG tablet; Take 1 tablet (200 mg total) by mouth daily.  Cyclothymic disorder in remission -     lamoTRIgine (LAMICTAL) 200 MG tablet; Take 1 tablet (200 mg total) by mouth daily.      Please see After Visit Summary for patient specific instructions.  No future appointments.  No orders of the defined types were placed in this encounter.     -------------------------------

## 2022-03-09 ENCOUNTER — Telehealth: Payer: BC Managed Care – PPO | Admitting: Behavioral Health

## 2022-03-14 NOTE — Progress Notes (Signed)
No charge. Pt was rescheduled

## 2022-03-17 ENCOUNTER — Telehealth (INDEPENDENT_AMBULATORY_CARE_PROVIDER_SITE_OTHER): Payer: BC Managed Care – PPO | Admitting: Behavioral Health

## 2022-03-17 ENCOUNTER — Encounter: Payer: Self-pay | Admitting: Behavioral Health

## 2022-03-17 DIAGNOSIS — F411 Generalized anxiety disorder: Secondary | ICD-10-CM | POA: Diagnosis not present

## 2022-03-17 DIAGNOSIS — F340 Cyclothymic disorder: Secondary | ICD-10-CM

## 2022-03-17 DIAGNOSIS — F902 Attention-deficit hyperactivity disorder, combined type: Secondary | ICD-10-CM

## 2022-03-17 DIAGNOSIS — F4311 Post-traumatic stress disorder, acute: Secondary | ICD-10-CM | POA: Diagnosis not present

## 2022-03-17 MED ORDER — DEXTROAMPHETAMINE SULFATE 5 MG PO TABS: ORAL_TABLET | ORAL | 0 refills | Status: DC

## 2022-03-17 MED ORDER — LAMOTRIGINE 200 MG PO TABS: 200.0000 mg | ORAL_TABLET | Freq: Every day | ORAL | 3 refills | Status: DC

## 2022-03-17 NOTE — Progress Notes (Signed)
Christy Hart [id number] 01/26/2001 21 y.o.  Virtual Visit via Video Note  I connected with pt @ on 03/17/22 at  2:30 PM EST by a video enabled telemedicine application and verified that I am speaking with the correct person using two identifiers.   I discussed the limitations of evaluation and management by telemedicine and the availability of in person appointments. The patient expressed understanding and agreed to proceed.  I discussed the assessment and treatment plan with the patient. The patient was provided an opportunity to ask questions and all were answered. The patient agreed with the plan and demonstrated an understanding of the instructions.   The patient was advised to call back or seek an in-person evaluation if the symptoms worsen or if the condition fails to improve as anticipated.  I provided 20  minutes of non-face-to-face time during this encounter.  The patient was located at home.  The provider was located at [facility].   Joan Flores, NP   Subjective:   Patient ID:  Christy Hart is a 21 y.o. (DOB 09/12/2000) female.  Chief Complaint: No chief complaint on file.   HPI Christy Hart presents via video visit for follow-up and medication refills.   Still living in [place]. Says that she is working and trying to be more active in her church.  She says she stopped taking Wellbutrin on her own and is actually feeling better. She would like to keep all of her other medications. Says her anxiety is 2/10 and depression is 2/10. She is sleeping 7-8 hours per night.  Says she will resume her dextrostat because she is having to read more and cannot retain what she has read.  She denies mania, no psychosis. No SI/HI.  Prior psychiatric medications: Wellbutrin   Review of Systems:  Review of Systems  Constitutional: Negative.   Allergic/Immunologic: Negative.   Neurological: Negative.   Psychiatric/Behavioral: Negative.      Medications: I  have reviewed the patient's current medications.  Current Outpatient Medications  Medication Sig Dispense Refill   buPROPion (WELLBUTRIN SR) 150 MG 12 hr tablet TAKE 1 TABLET BY MOUTH DAILY AFTER BREAKFAST 30 tablet 2   dextroamphetamine (DEXTROSTAT) 5 MG tablet TAKE 1 TABLET TWICE A DAY WITH BREAKFAST. 30 tablet 0   drospirenone-ethinyl estradiol (YASMIN,ZARAH,SYEDA) 3-0.03 MG tablet      hydrOXYzine (ATARAX) 25 MG tablet Take 1 tablet (25 mg total) by mouth 3 (three) times daily as needed for anxiety or nausea (sleep). 90 tablet 3   lamoTRIgine (LAMICTAL) 150 MG tablet TAKE ONE TABLET BY MOUTH ONCE DAILY 30 tablet 1   lamoTRIgine (LAMICTAL) 200 MG tablet Take 1 tablet (200 mg total) by mouth daily. 30 tablet 3   valACYclovir (VALTREX) 500 MG tablet Take by mouth.     No current facility-administered medications for this visit.    Medication Side Effects: None  Allergies:  Allergies  Allergen Reactions   Amoxicillin Other (See Comments)    Turns chalk Ayden Hardwick and does not act normal self, woozy   Azithromycin     Causes the pt to turn grey in color and does not act her normal self, woozy   Ciprofloxacin     Unsure reaction     Past Medical History:  Diagnosis Date   ADD (attention deficit disorder)    Allergy     Family History  Problem Relation Age of Onset   Hashimoto's thyroiditis Mother    Stroke Maternal Grandmother 51       +  TOBACCO (2 ppd smoker)    Social History   Socioeconomic History   Marital status: Single    Spouse name: n/a   Number of children: 0   Years of education: 12   Highest education level: High school graduate  Occupational History   Occupation: Waitress  Tobacco Use   Smoking status: Never   Smokeless tobacco: Never  Vaping Use   Vaping Use: Never used  Substance and Sexual Activity   Alcohol use: No   Drug use: No   Sexual activity: Yes    Partners: Male    Birth control/protection: Pill  Other Topics Concern   Not on file   Social History Narrative   Lives with both parents and an older brother in the same house.   Social Determinants of Health   Financial Resource Strain: Not on file  Food Insecurity: Not on file  Transportation Needs: Not on file  Physical Activity: Not on file  Stress: Not on file  Social Connections: Not on file  Intimate Partner Violence: Not on file    Past Medical History, Surgical history, Social history, and Family history were reviewed and updated as appropriate.   Please see review of systems for further details on the patient's review from today.   Objective:   Physical Exam:  There were no vitals taken for this visit.  Physical Exam  Lab Review:     Component Value Date/Time   NA 136 01/31/2017 1004   K 4.4 01/31/2017 1004   CL 99 01/31/2017 1004   CO2 24 01/31/2017 1004   GLUCOSE 81 01/31/2017 1004   GLUCOSE 65 08/31/2015 1920   BUN 9 01/31/2017 1004   CREATININE 0.94 01/31/2017 1004   CREATININE 0.65 08/31/2015 1920   CALCIUM 9.7 01/31/2017 1004   PROT 6.9 01/31/2017 1004   ALBUMIN 4.9 01/31/2017 1004   AST 15 01/31/2017 1004   ALT 7 01/31/2017 1004   ALKPHOS 55 01/31/2017 1004   BILITOT 0.3 01/31/2017 1004   GFRNONAA CANCELED 01/31/2017 1004   GFRAA CANCELED 01/31/2017 1004       Component Value Date/Time   WBC 8.1 01/22/2018 1437   WBC 7.5 12/29/2014 0417   RBC 4.30 01/22/2018 1437   RBC 3.96 12/29/2014 0417   HGB 12.6 01/22/2018 1437   HGB 13.8 01/31/2017 1004   HCT 36.4 (A) 01/22/2018 1437   HCT 40.7 01/31/2017 1004   PLT 306 01/31/2017 1004   MCV 84.6 01/22/2018 1437   MCV 89 01/31/2017 1004   MCH 29.4 01/22/2018 1437   MCH 29.3 12/29/2014 0417   MCHC 34.7 01/22/2018 1437   MCHC 33.6 12/29/2014 0417   RDW 12.9 01/31/2017 1004   LYMPHSABS 2.0 01/31/2017 1004   MONOABS 0.9 08/11/2012 1630   EOSABS 0.0 01/31/2017 1004   BASOSABS 0.0 01/31/2017 1004    No results found for: "POCLITH", "LITHIUM"   No results found for:  "PHENYTOIN", "PHENOBARB", "VALPROATE", "CBMZ"   .res Assessment: Plan:    RECOMMENDATIONS:    Greater than 50% of 30 min face to face time with patient was spent on counseling and coordination of care. We discussed her current level of stability since last visit. She says that she stopped Wellbutrin and feels better. She has reintroduced greater spirituality into her life. Very active in bible study. She is reading much more and says that she would like to continue her dextrostat because it is difficult to retain information at times. Her depression and anxiety levels continue to be  well controlled.   Continue on current medication regimen Continue  Lamotrigine to 200 mg daily Continue Dextrostat 5 mg tablet to once daily Continue Hydroxyzine 25 mg tablet three times daily as needed To follow up in 3 months to reasess Reviewed PDMP Reinforced effects of Lamotrigine and may reduce reliability of BC. She is currently on oral BC. [facility] within weeks.      Joan Flores, NP                  Diagnoses and all orders for this visit:     There are no diagnoses linked to this encounter.   Please see After Visit Summary for patient specific instructions.  No future appointments.  No orders of the defined types were placed in this encounter.     -------------------------------

## 2022-06-23 ENCOUNTER — Encounter: Payer: Self-pay | Admitting: Behavioral Health

## 2022-06-23 ENCOUNTER — Telehealth (INDEPENDENT_AMBULATORY_CARE_PROVIDER_SITE_OTHER): Payer: BC Managed Care – PPO | Admitting: Behavioral Health

## 2022-06-23 DIAGNOSIS — F4311 Post-traumatic stress disorder, acute: Secondary | ICD-10-CM

## 2022-06-23 DIAGNOSIS — F411 Generalized anxiety disorder: Secondary | ICD-10-CM

## 2022-06-23 DIAGNOSIS — F902 Attention-deficit hyperactivity disorder, combined type: Secondary | ICD-10-CM | POA: Diagnosis not present

## 2022-06-23 DIAGNOSIS — F340 Cyclothymic disorder: Secondary | ICD-10-CM

## 2022-06-23 MED ORDER — HYDROXYZINE HCL 25 MG PO TABS: 25.0000 mg | ORAL_TABLET | Freq: Three times a day (TID) | ORAL | 5 refills | Status: DC | PRN

## 2022-06-23 MED ORDER — LAMOTRIGINE 200 MG PO TABS: 200.0000 mg | ORAL_TABLET | Freq: Every day | ORAL | 5 refills | Status: DC

## 2022-06-23 NOTE — Progress Notes (Addendum)
[facility]  Patient ID: Christy Hart,  MRN: [id number]  PCP: Wendie Agreste, MD  Date of Evaluation: 06/23/2022 Time spent:20 minutes  Chief Complaint:  Chief Complaint   Anxiety; Medication Refill; Patient Education; Follow-up; ADHD    Virtual Visit via Telephone Note   I connected with pt on 02/022/24 at 11:00 AM EST by telephone and verified that I am speaking with the correct person using two identifiers.   I discussed the limitations, risks, security and privacy concerns of performing an evaluation and management service by telephone and the availability of in person appointments. I also discussed with the patient that there may be a patient responsible charge related to this service. The patient expressed understanding and agreed to proceed.   I discussed the assessment and treatment plan with the patient. The patient was provided an opportunity to ask questions and all were answered. The patient agreed with the plan and demonstrated an understanding of the instructions.   The patient was advised to call back or seek an in-person evaluation if the symptoms worsen or if the condition fails to improve as anticipated.   I provided 20 minutes of non-face-to-face time during this encounter.  The patient was located at home.  The provider was located at [place].   HISTORY/CURRENT STATUS: HPI  Christy Hart presents via video visit for follow-up and medication refills.   Still living in [place]. Says that she is working and trying to be more active in her church.  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 Says that she is doing well with just her Lamictal and Hydroxyzine. Says her anxiety is 2/10 and depression is 2/10. She is sleeping 7-8 hours per night.  Says she will resume her dextrostat because she is having to read more and cannot retain what she has read.  She denies mania, no psychosis. No SI/HI.   Prior  psychiatric medications: Wellbutrin   Individual Medical History/ Review of Systems: Changes? :No   Allergies: Amoxicillin, Azithromycin, and Ciprofloxacin  Current Medications:  Current Outpatient Medications:    buPROPion (WELLBUTRIN SR) 150 MG 12 hr tablet, TAKE 1 TABLET BY MOUTH DAILY AFTER BREAKFAST, Disp: 30 tablet, Rfl: 2   dextroamphetamine (DEXTROSTAT) 5 MG tablet, TAKE 1 TABLET TWICE A DAY WITH BREAKFAST., Disp: 30 tablet, Rfl: 0   drospirenone-ethinyl estradiol (YASMIN,ZARAH,SYEDA) 3-0.03 MG tablet, , Disp: , Rfl:    hydrOXYzine (ATARAX) 25 MG tablet, Take 1 tablet (25 mg total) by mouth 3 (three) times daily as needed for anxiety or nausea (sleep)., Disp: 90 tablet, Rfl: 5   lamoTRIgine (LAMICTAL) 150 MG tablet, TAKE ONE TABLET BY MOUTH ONCE DAILY, Disp: 30 tablet, Rfl: 1   lamoTRIgine (LAMICTAL) 200 MG tablet, Take 1 tablet (200 mg total) by mouth daily., Disp: 30 tablet, Rfl: 5   valACYclovir (VALTREX) 500 MG tablet, Take by mouth., Disp: , Rfl:  Medication Side Effects: none  Family Medical/ Social History: Changes? No  MENTAL HEALTH EXAM:  There were no vitals taken for this visit.There is no height or weight on file to calculate BMI.  General Appearance: Casual, Neat, and Well Groomed  Eye Contact:  Good  Speech:  Clear and Coherent  Volume:  Normal  Mood:  NA  Affect:  Appropriate  Thought Process:  Coherent  Orientation:  Full (Time, Place, and Person)  Thought Content: Logical   Suicidal Thoughts:  No  Homicidal Thoughts:  No  Memory:  WNL  Judgement:  Good  Insight:  Good  Psychomotor Activity:  Normal  Concentration:  Concentration: Good  Recall:  Good  Fund of Knowledge: Good  Language: Good  Assets:  Desire for Improvement  ADL's:  Intact  Cognition: WNL  Prognosis:  Good    DIAGNOSES:    ICD-10-CM   1. Generalized anxiety disorder  F41.1 lamoTRIgine (LAMICTAL) 200 MG tablet    2. Attention deficit hyperactivity disorder (ADHD), combined  type, moderate  F90.2     3. Cyclothymic disorder  F34.0     4. Acute posttraumatic stress disorder  F43.11 hydrOXYzine (ATARAX) 25 MG tablet    lamoTRIgine (LAMICTAL) 200 MG tablet    5. Cyclothymic disorder in remission  F34.0 lamoTRIgine (LAMICTAL) 200 MG tablet      Receiving Psychotherapy: No    RECOMMENDATIONS:   Greater than 50% of 30 min video time with patient was spent on counseling and coordination of care. We discussed her current level of stability since last visit. She has reintroduced greater spirituality into her life. Very active in bible study. She has stopped vaping for one week so far. Her depression and anxiety levels continue to be well controlled.   Continue on current medication regimen Continue  Lamotrigine to 200 mg daily Continue Dextrostat 5 mg tablet to once daily Continue Hydroxyzine 25 mg tablet three times daily as needed To follow up in 5 months to reasess per pt Reviewed PDMP Reinforced effects of Lamotrigine and may reduce reliability of BC. She is currently on oral BC. [place] within weeks.        Elwanda Brooklyn, NP

## 2022-08-29 ENCOUNTER — Telehealth (INDEPENDENT_AMBULATORY_CARE_PROVIDER_SITE_OTHER): Payer: BC Managed Care – PPO | Admitting: Behavioral Health

## 2022-08-29 ENCOUNTER — Encounter: Payer: Self-pay | Admitting: Behavioral Health

## 2022-08-29 DIAGNOSIS — F411 Generalized anxiety disorder: Secondary | ICD-10-CM

## 2022-08-29 DIAGNOSIS — F902 Attention-deficit hyperactivity disorder, combined type: Secondary | ICD-10-CM

## 2022-08-29 DIAGNOSIS — F40248 Other situational type phobia: Secondary | ICD-10-CM

## 2022-08-29 NOTE — Progress Notes (Signed)
Christy Hart [id number] 19-Dec-2000 21 y.o.  Virtual Visit via Video Note  I connected with pt @ on 08/29/22 at 11:30 AM EDT by a video enabled telemedicine application and verified that I am speaking with the correct person using two identifiers.   I discussed the limitations of evaluation and management by telemedicine and the availability of in person appointments. The patient expressed understanding and agreed to proceed.  I discussed the assessment and treatment plan with the patient. The patient was provided an opportunity to ask questions and all were answered. The patient agreed with the plan and demonstrated an understanding of the instructions.   The patient was advised to call back or seek an in-person evaluation if the symptoms worsen or if the condition fails to improve as anticipated.  I provided 30 minutes of non-face-to-face time during this encounter.  The patient was located at home.  The provider was located at [facility].   Joan Flores, NP   Subjective:   Patient ID:  Christy Hart is a 22 y.o. (DOB 09/06/2000) female.  Chief Complaint:  Chief Complaint  Patient presents with   Anxiety   Follow-up   Patient Education   Medication Problem    HPI  Christy Hart presents via video visit for follow-up and medication refills. 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 Says her neck and face will get flush and HR increase.  Still living in [place]. Says that she is working and trying to be more active in her church.  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 Says that she is doing well with just her Lamictal and Hydroxyzine. Says her anxiety is 2/10 and depression is 2/10. She is sleeping 7-8 hours per night.  Says she will resume her dextrostat because she is having to read more and cannot retain what she has read.  She denies  mania, no psychosis. No SI/HI.   Prior psychiatric medications: Wellbutrin  Review of Systems:  Review of Systems  Constitutional: Negative.   Allergic/Immunologic: Negative.   Neurological: Negative.   Psychiatric/Behavioral:  The patient is nervous/anxious.     Medications: I have reviewed the patient's current medications.  Current Outpatient Medications  Medication Sig Dispense Refill   buPROPion (WELLBUTRIN SR) 150 MG 12 hr tablet TAKE 1 TABLET BY MOUTH DAILY AFTER BREAKFAST 30 tablet 2   dextroamphetamine (DEXTROSTAT) 5 MG tablet TAKE 1 TABLET TWICE A DAY WITH BREAKFAST. 30 tablet 0   drospirenone-ethinyl estradiol (YASMIN,ZARAH,SYEDA) 3-0.03 MG tablet      hydrOXYzine (ATARAX) 25 MG tablet Take 1 tablet (25 mg total) by mouth 3 (three) times daily as needed for anxiety or nausea (sleep). 90 tablet 5   lamoTRIgine (LAMICTAL) 150 MG tablet TAKE ONE TABLET BY MOUTH ONCE DAILY 30 tablet 1   lamoTRIgine (LAMICTAL) 200 MG tablet Take 1 tablet (200 mg total) by mouth daily. 30 tablet 5   valACYclovir (VALTREX) 500 MG tablet Take by mouth.     No current facility-administered medications for this visit.    Medication Side Effects: None  Allergies:  Allergies  Allergen Reactions   Amoxicillin Other (See Comments)    Turns chalk [place] and does not act normal self, woozy   Azithromycin     Causes the pt to turn grey in color and does not act her normal self, woozy   Ciprofloxacin  Unsure reaction     Past Medical History:  Diagnosis Date   ADD (attention deficit disorder)    Allergy     Family History  Problem Relation Age of Onset   Hashimoto's thyroiditis Mother    Stroke Maternal Grandmother 17       +TOBACCO (2 ppd smoker)    Social History   Socioeconomic History   Marital status: Single    Spouse name: n/a   Number of children: 0   Years of education: 12   Highest education level: High school graduate  Occupational History   Occupation: Waitress   Tobacco Use   Smoking status: Never   Smokeless tobacco: Never  Vaping Use   Vaping Use: Never used  Substance and Sexual Activity   Alcohol use: No   Drug use: No   Sexual activity: Yes    Partners: Male    Birth control/protection: Pill  Other Topics Concern   Not on file  Social History Narrative   Lives with both parents and an older brother in the same house.   Social Determinants of Health   Financial Resource Strain: Not on file  Food Insecurity: Not on file  Transportation Needs: Not on file  Physical Activity: Not on file  Stress: Not on file  Social Connections: Not on file  Intimate Partner Violence: Not on file    Past Medical History, Surgical history, Social history, and Family history were reviewed and updated as appropriate.   Please see review of systems for further details on the patient's review from today.   Objective:   Physical Exam:  There were no vitals taken for this visit.  Physical Exam Neurological:     Mental Status: She is alert and oriented to person, place, and time.  Psychiatric:        Attention and Perception: Attention and perception normal.        Mood and Affect: Mood normal.        Speech: Speech normal.        Behavior: Behavior normal. Behavior is cooperative.        Cognition and Memory: Cognition and memory normal.        Judgment: Judgment normal.     Comments: Insight intact     Lab Review:     Component Value Date/Time   NA 136 01/31/2017 1004   K 4.4 01/31/2017 1004   CL 99 01/31/2017 1004   CO2 24 01/31/2017 1004   GLUCOSE 81 01/31/2017 1004   GLUCOSE 65 08/31/2015 1920   BUN 9 01/31/2017 1004   CREATININE 0.94 01/31/2017 1004   CREATININE 0.65 08/31/2015 1920   CALCIUM 9.7 01/31/2017 1004   PROT 6.9 01/31/2017 1004   ALBUMIN 4.9 01/31/2017 1004   AST 15 01/31/2017 1004   ALT 7 01/31/2017 1004   ALKPHOS 55 01/31/2017 1004   BILITOT 0.3 01/31/2017 1004   GFRNONAA CANCELED 01/31/2017 1004   GFRAA  CANCELED 01/31/2017 1004       Component Value Date/Time   WBC 8.1 01/22/2018 1437   WBC 7.5 12/29/2014 0417   RBC 4.30 01/22/2018 1437   RBC 3.96 12/29/2014 0417   HGB 12.6 01/22/2018 1437   HGB 13.8 01/31/2017 1004   HCT 36.4 (A) 01/22/2018 1437   HCT 40.7 01/31/2017 1004   PLT 306 01/31/2017 1004   MCV 84.6 01/22/2018 1437   MCV 89 01/31/2017 1004   MCH 29.4 01/22/2018 1437   MCH 29.3 12/29/2014 0417   MCHC  34.7 01/22/2018 1437   MCHC 33.6 12/29/2014 0417   RDW 12.9 01/31/2017 1004   LYMPHSABS 2.0 01/31/2017 1004   MONOABS 0.9 08/11/2012 1630   EOSABS 0.0 01/31/2017 1004   BASOSABS 0.0 01/31/2017 1004    No results found for: "POCLITH", "LITHIUM"   No results found for: "PHENYTOIN", "PHENOBARB", "VALPROATE", "CBMZ"   .res Assessment: Plan:    Greater than 50% of 30 min video time with patient was spent on counseling and coordination of care. We discussed her current level of stability since last visit. Talked about performance anxiety and public speaking.  She has reintroduced greater spirituality into her life. Very active in bible study.   Will start Inderal 10 mg three times daily as needed for anxiety or before public speaking event.   Continue  Lamotrigine to 200 mg daily Continue Dextrostat 5 mg tablet to once daily Continue Hydroxyzine 25 mg tablet three times daily as needed To follow up in 3 months to reasess per pt Reviewed PDMP Reinforced effects of Lamotrigine and may reduce reliability of BC. She is currently on oral BC. LMC within weeks.            There are no diagnoses linked to this encounter.   Please see After Visit Summary for patient specific instructions.  No future appointments.  No orders of the defined types were placed in this encounter.     -------------------------------

## 2022-08-30 ENCOUNTER — Telehealth: Payer: Self-pay | Admitting: Behavioral Health

## 2022-08-30 ENCOUNTER — Other Ambulatory Visit: Payer: Self-pay | Admitting: Behavioral Health

## 2022-08-30 MED ORDER — PROPRANOLOL HCL 10 MG PO TABS: 10.0000 mg | ORAL_TABLET | Freq: Three times a day (TID) | ORAL | 0 refills | Status: DC | PRN

## 2022-08-30 NOTE — Telephone Encounter (Signed)
Pt called and was seen yesterday. Arlys John was suppose to have sent in a script of inderal 10 mg to the [facility] on [address] in [place]

## 2022-08-30 NOTE — Telephone Encounter (Signed)
Sent!

## 2022-09-15 ENCOUNTER — Other Ambulatory Visit: Payer: Self-pay | Admitting: Behavioral Health

## 2022-09-15 DIAGNOSIS — F411 Generalized anxiety disorder: Secondary | ICD-10-CM

## 2022-09-15 DIAGNOSIS — F4311 Post-traumatic stress disorder, acute: Secondary | ICD-10-CM

## 2022-09-15 DIAGNOSIS — F340 Cyclothymic disorder: Secondary | ICD-10-CM

## 2022-10-07 ENCOUNTER — Other Ambulatory Visit: Payer: Self-pay | Admitting: Behavioral Health

## 2022-11-18 ENCOUNTER — Ambulatory Visit: Payer: BC Managed Care – PPO | Admitting: Family Medicine

## 2022-11-18 VITALS — BP 108/60 | HR 77 | Temp 97.9°F | Ht 65.0 in | Wt 149.8 lb

## 2022-11-18 DIAGNOSIS — Z23 Encounter for immunization: Secondary | ICD-10-CM

## 2022-11-18 DIAGNOSIS — Z8639 Personal history of other endocrine, nutritional and metabolic disease: Secondary | ICD-10-CM | POA: Diagnosis not present

## 2022-11-18 DIAGNOSIS — Z8349 Family history of other endocrine, nutritional and metabolic diseases: Secondary | ICD-10-CM

## 2022-11-18 DIAGNOSIS — R6889 Other general symptoms and signs: Secondary | ICD-10-CM | POA: Diagnosis not present

## 2022-11-18 DIAGNOSIS — R42 Dizziness and giddiness: Secondary | ICD-10-CM | POA: Diagnosis not present

## 2022-11-18 NOTE — Progress Notes (Unsigned)
Subjective:  Patient ID: Christy Hart, female    DOB: 2001/03/22  Age: 22 y.o. MRN: [id number]  CC:  Chief Complaint  Patient presents with   Immunizations    Pt here to update imms for a mission statement    Chills    Pt mother notes she is constantly cold, no other sxs but has been a constant the last several years     HPI Christy Hart presents for   Cold intolerance: Noted past few years, low iron in past - during menses, Dr. Elon Spanner at [facility]. No prior iron supplement. No recent testing. Plans to follow up for some other concerns.  No palpitations. No hair or skin changes. Sometimes feels lightheaded when leaning forward. No syncope.  Last menses a week ago, 6- 7 days, heavy bleeding. No recent changes. Has been on OCP past years. Helped some, still heavy at times.  Thyroid disease in family, no personal testing.  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 Living in [place]. Paperwork required next few weeks.   Unknown location of travel next, but will discuss vaccines for travel at school.  Immunization record reviewed - possible need for 2nd Hepatitis A  will hold until travel.  Tdap due.  No recent meningitis - age 61. Due.   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   Attention deficit hyperactivity disorder (ADHD), combined type, moderate 11/17/2011   Past Medical History:  Diagnosis Date   ADD (attention deficit disorder)    Allergy    Past Surgical History:  Procedure Laterality Date   ADENOIDECTOMY      TONSILLECTOMY AND ADENOIDECTOMY     TYMPANOSTOMY TUBE PLACEMENT     Allergies  Allergen Reactions   Amoxicillin Other (See Comments)    [name], woozy   Azithromycin     Causes the pt to turn grey in color and does not act her normal self, woozy   Ciprofloxacin     Unsure reaction    Prior to Admission medications   Medication Sig Start Date End Date Taking? Authorizing Provider  drospirenone-ethinyl estradiol (YASMIN,ZARAH,SYEDA) 3-0.03 MG tablet  08/10/17  Yes [provider]  hydrOXYzine (ATARAX) 25 MG tablet Take 1 tablet (25 mg total) by mouth 3 (three) times daily as needed for anxiety or nausea (sleep). 06/23/22  Yes Avelina Laine A, NP  lamoTRIgine (LAMICTAL) 200 MG tablet TAKE 1 TABLET(200 MG) BY MOUTH DAILY 09/16/22  Yes White, Arlys John A, NP  propranolol (INDERAL) 10 MG tablet TAKE 1 TABLET(10 MG) BY MOUTH THREE TIMES DAILY AS NEEDED 10/07/22  Yes Avelina Laine A, NP  valACYclovir (VALTREX) 500 MG tablet Take by mouth. 12/22/20  Yes [provider]  buPROPion (WELLBUTRIN SR) 150 MG 12 hr tablet TAKE 1 TABLET BY MOUTH DAILY AFTER BREAKFAST Patient not taking: Reported on 11/18/2022 04/12/21   Joan Flores, NP  dextroamphetamine (DEXTROSTAT) 5 MG tablet TAKE 1 TABLET TWICE A DAY WITH BREAKFAST. 03/17/22 04/16/22  Joan Flores, NP  lamoTRIgine (LAMICTAL) 150  MG tablet TAKE ONE TABLET BY MOUTH ONCE DAILY Patient not taking: Reported on 11/18/2022 05/14/21   Joan Flores, NP  fluticasone (FLONASE) 50 MCG/ACT nasal spray Place 2 sprays into both nostrils daily. 07/26/18 05/16/20  Doristine Bosworth, MD   Social History   Socioeconomic History   Marital status: Single    Spouse name: n/a   Number of children: 0   Years of education: 12   Highest education level: High school graduate  Occupational History   Occupation: Waitress  Tobacco Use   Smoking status: Never   Smokeless tobacco: Never  Vaping Use   Vaping status: Never Used   Substance and Sexual Activity   Alcohol use: No   Drug use: No   Sexual activity: Yes    Partners: Male    Birth control/protection: Pill  Other Topics Concern   Not on file  Social History Narrative   Lives with both parents and an older brother in the same house.   Social Determinants of Health   Financial Resource Strain: Not on file  Food Insecurity: Not on file  Transportation Needs: Not on file  Physical Activity: Not on file  Stress: Not on file  Social Connections: Unknown (09/10/2021)   Received from [facility]   Social Network    Social Network: Not on file  Intimate Partner Violence: Unknown (08/02/2021)   Received from [facility]   HITS    Physically Hurt: Not on file    Insult or Talk Down To: Not on file    Threaten Physical Harm: Not on file    Scream or Curse: Not on file    Review of Systems  Per HPI.  Objective:   Vitals:   11/18/22 0947  BP: 108/60  Pulse: 77  Temp: 97.9 F (36.6 C)  TempSrc: Temporal  SpO2: 98%  Weight: 149 lb 12.8 oz (67.9 kg)  Height: 5\' 5"  (1.651 m)   Physical Exam Vitals reviewed.  Constitutional:      Appearance: Normal appearance. She is well-developed.  HENT:     Head: Normocephalic and atraumatic.  Eyes:     Conjunctiva/sclera: Conjunctivae normal.     Pupils: Pupils are equal, round, and reactive to light.  Neck:     Vascular: No carotid bruit.  Cardiovascular:     Rate and Rhythm: Normal rate and regular rhythm.     Heart sounds: Normal heart sounds.  Pulmonary:     Effort: Pulmonary effort is normal.     Breath sounds: Normal breath sounds.  Abdominal:     Palpations: Abdomen is soft. There is no pulsatile mass.     Tenderness: There is no abdominal tenderness.  Musculoskeletal:     Right lower leg: No edema.     Left lower leg: No edema.  Skin:    General: Skin is warm and dry.  Neurological:     Mental Status: She is alert and oriented to person, place, and  time.  Psychiatric:        Mood and Affect: Mood normal.        Behavior: Behavior normal.        Assessment & Plan:  Christy Hart is a 22 y.o. female . Need for Tdap vaccination   No orders of the defined types were placed in this encounter.  There are no Patient Instructions on file for this visit.    Signed,   Meredith Staggers, MD [facility] [place]  [facility] 11/18/22 10:16 AM

## 2022-11-18 NOTE — Patient Instructions (Addendum)
Nurse visit next week for tdap vaccine and meningitis vaccine. Will also check blood work at that time. We can discuss those results and plan further at follow up visit. I will complete form when you have vaccines. Thanks for coming in today and take care!

## 2022-11-19 ENCOUNTER — Encounter: Payer: Self-pay | Admitting: Family Medicine

## 2022-11-24 ENCOUNTER — Other Ambulatory Visit: Payer: BC Managed Care – PPO

## 2022-11-24 ENCOUNTER — Ambulatory Visit: Payer: BC Managed Care – PPO

## 2022-11-29 ENCOUNTER — Telehealth: Payer: Self-pay

## 2022-11-29 NOTE — Telephone Encounter (Signed)
Pt called and states she is coming in on 12/08/22 for vaccines and blood work . She was here to see Dr Neva Seat when the [employer] update occurred and shut things down . She is asking can we do food allergy blood work that day or does she need another apt with DR Neva Seat ?  She states she is having stomach pain and cramps , along with diarrhea . She thinks it is from diary but she has not kept a food diary .  What would you recommend ?

## 2022-11-29 NOTE — Telephone Encounter (Signed)
We did discuss a separate visit to discuss those symptoms further, then we can decide on specific testing.  Please schedule appointment.  Thanks.

## 2022-11-29 NOTE — Telephone Encounter (Signed)
Left vm to call and make appt.

## 2022-11-30 ENCOUNTER — Encounter (INDEPENDENT_AMBULATORY_CARE_PROVIDER_SITE_OTHER): Payer: Self-pay

## 2022-12-08 ENCOUNTER — Ambulatory Visit: Payer: BC Managed Care – PPO

## 2022-12-15 ENCOUNTER — Ambulatory Visit (INDEPENDENT_AMBULATORY_CARE_PROVIDER_SITE_OTHER): Payer: BC Managed Care – PPO

## 2022-12-15 DIAGNOSIS — Z8639 Personal history of other endocrine, nutritional and metabolic disease: Secondary | ICD-10-CM

## 2022-12-15 DIAGNOSIS — R6889 Other general symptoms and signs: Secondary | ICD-10-CM

## 2022-12-15 DIAGNOSIS — R42 Dizziness and giddiness: Secondary | ICD-10-CM

## 2022-12-15 DIAGNOSIS — Z8349 Family history of other endocrine, nutritional and metabolic diseases: Secondary | ICD-10-CM

## 2022-12-15 DIAGNOSIS — Z23 Encounter for immunization: Secondary | ICD-10-CM | POA: Diagnosis not present

## 2022-12-15 LAB — CBC
HCT: 39.8 % (ref 36.0–46.0)
Hemoglobin: 13 g/dL (ref 12.0–15.0)
MCHC: 32.6 g/dL (ref 30.0–36.0)
MCV: 89.4 fl (ref 78.0–100.0)
Platelets: 357 10*3/uL (ref 150.0–400.0)
RBC: 4.45 Mil/uL (ref 3.87–5.11)
RDW: 12.8 % (ref 11.5–15.5)
WBC: 6.6 10*3/uL (ref 4.0–10.5)

## 2022-12-15 LAB — BASIC METABOLIC PANEL
BUN: 12 mg/dL (ref 6–23)
CO2: 25 mEq/L (ref 19–32)
Calcium: 9.8 mg/dL (ref 8.4–10.5)
Chloride: 100 mEq/L (ref 96–112)
Creatinine, Ser: 0.86 mg/dL (ref 0.40–1.20)
GFR: 95.58 mL/min (ref >60.00)
Glucose, Bld: 54 mg/dL — ABNORMAL LOW (ref 70–99)
Potassium: 3.6 mEq/L (ref 3.5–5.1)
Sodium: 137 mEq/L (ref 135–145)

## 2022-12-15 LAB — TSH: TSH: 1.55 u[IU]/mL (ref 0.35–5.50)

## 2022-12-16 ENCOUNTER — Telehealth: Payer: Self-pay

## 2022-12-16 ENCOUNTER — Other Ambulatory Visit: Payer: Self-pay

## 2022-12-16 LAB — IRON,TIBC AND FERRITIN PANEL
%SAT: 25 % (ref 16–45)
Ferritin: 11 ng/mL — ABNORMAL LOW (ref 16–154)
Iron: 129 ug/dL (ref 40–190)
TIBC: 516 ug/dL — ABNORMAL HIGH (ref 250–450)

## 2022-12-16 MED ORDER — BLOOD GLUCOSE MONITORING SUPPL DEVI: 1.0000 | Freq: Three times a day (TID) | 0 refills | Status: AC

## 2022-12-16 MED ORDER — BLOOD GLUCOSE TEST VI STRP: 1.0000 | ORAL_STRIP | Freq: Three times a day (TID) | 0 refills | Status: DC

## 2022-12-16 MED ORDER — LANCET DEVICE MISC: 1.0000 | Freq: Three times a day (TID) | 0 refills | Status: AC

## 2022-12-16 MED ORDER — LANCETS MISC. MISC: 1.0000 | Freq: Three times a day (TID) | 0 refills | Status: AC

## 2022-12-16 NOTE — Telephone Encounter (Signed)
error 

## 2022-12-16 NOTE — Telephone Encounter (Signed)
-----   Message from Shade Flood sent at 12/16/2022  1:45 PM EDT ----- Call patient.  Blood sugar was low at 54.  Was she feeling bad when blood work was drawn yesterday?  If she is having episodes of sweating, lightheadedness, dizziness or acute onset of fatigue would want to make sure that she is not having continued low blood sugars.  Let me know and we can get her a glucometer to check levels prior to her follow-up visit.  Other electrolytes looked okay.  Blood counts looked okay.  A few abnormalities on the iron testing but again no sign of anemia.  Can discuss further visit.  Thyroid test was normal.  Let me know if there are questions.

## 2022-12-21 ENCOUNTER — Ambulatory Visit: Payer: BC Managed Care – PPO | Admitting: Behavioral Health

## 2022-12-21 ENCOUNTER — Encounter: Payer: Self-pay | Admitting: Behavioral Health

## 2022-12-21 VITALS — Wt 150.0 lb

## 2022-12-21 DIAGNOSIS — F411 Generalized anxiety disorder: Secondary | ICD-10-CM | POA: Diagnosis not present

## 2022-12-21 DIAGNOSIS — F4311 Post-traumatic stress disorder, acute: Secondary | ICD-10-CM

## 2022-12-21 DIAGNOSIS — F39 Unspecified mood [affective] disorder: Secondary | ICD-10-CM | POA: Diagnosis not present

## 2022-12-21 DIAGNOSIS — F902 Attention-deficit hyperactivity disorder, combined type: Secondary | ICD-10-CM | POA: Diagnosis not present

## 2022-12-21 DIAGNOSIS — F340 Cyclothymic disorder: Secondary | ICD-10-CM

## 2022-12-21 MED ORDER — DEXTROAMPHETAMINE SULFATE 5 MG PO TABS
ORAL_TABLET | ORAL | 0 refills | Status: DC
Start: 2022-12-21 — End: 2023-07-31

## 2022-12-21 MED ORDER — LAMOTRIGINE 150 MG PO TABS: 150.0000 mg | ORAL_TABLET | Freq: Every day | ORAL | 1 refills | Status: DC

## 2022-12-21 MED ORDER — HYDROXYZINE HCL 25 MG PO TABS: 25.0000 mg | ORAL_TABLET | Freq: Three times a day (TID) | ORAL | 1 refills | Status: DC | PRN

## 2022-12-21 NOTE — Progress Notes (Signed)
[facility]  Patient ID: Christy Hart,  MRN: [id number]  PCP: Shade Flood, MD  Date of Evaluation: 12/21/2022 Time spent:30 minutes  Chief Complaint:   HISTORY/CURRENT STATUS: HPI Christy Hart presents for follow-up and medication refills.  Says she is doing well with depression and anxiety. She would like to reduce her Lamictal.  Still living in [place]. Says that she is working and trying to be more active in her church.  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 Says that she is doing well with just her Lamictal and Hydroxyzine. Says her anxiety is 2/10 and depression is 2/10. She is sleeping 7-8 hours per night.  Says she will resume her dextrostat because she is having to read more and cannot retain what she has read.  She denies mania, no psychosis. No SI/HI.   Prior psychiatric medications: Wellbutrin      Individual Medical History/ Review of Systems: Changes? :No   Allergies: Amoxicillin, Azithromycin, and Ciprofloxacin  Current Medications:  Current Outpatient Medications:    lamoTRIgine (LAMICTAL) 150 MG tablet, Take 1 tablet (150 mg total) by mouth daily., Disp: 90 tablet, Rfl: 1   Blood Glucose Monitoring Suppl DEVI, 1 each by Does not apply route in the morning, at noon, and at bedtime. May substitute to any manufacturer covered by patient's insurance., Disp: 1 each, Rfl: 0   buPROPion (WELLBUTRIN SR) 150 MG 12 hr tablet, TAKE 1 TABLET BY MOUTH DAILY AFTER BREAKFAST (Patient not taking: Reported on 11/18/2022), Disp: 30 tablet, Rfl: 2   dextroamphetamine (DEXTROSTAT) 5 MG tablet, TAKE 1 TABLET TWICE A DAY WITH BREAKFAST., Disp: 30 tablet, Rfl: 0   drospirenone-ethinyl estradiol (YASMIN,ZARAH,SYEDA) 3-0.03 MG tablet, , Disp: , Rfl:    Glucose Blood (BLOOD GLUCOSE TEST STRIPS) STRP, 1 each by In Vitro route in the morning, at noon, and at bedtime. May substitute to any manufacturer covered by patient's insurance., Disp: 100  strip, Rfl: 0   hydrOXYzine (ATARAX) 25 MG tablet, Take 1 tablet (25 mg total) by mouth 3 (three) times daily as needed for anxiety or nausea (sleep)., Disp: 270 tablet, Rfl: 1   lamoTRIgine (LAMICTAL) 150 MG tablet, TAKE ONE TABLET BY MOUTH ONCE DAILY (Patient not taking: Reported on 11/18/2022), Disp: 30 tablet, Rfl: 1   lamoTRIgine (LAMICTAL) 200 MG tablet, TAKE 1 TABLET(200 MG) BY MOUTH DAILY, Disp: 30 tablet, Rfl: 2   Lancet Device MISC, 1 each by Does not apply route in the morning, at noon, and at bedtime. May substitute to any manufacturer covered by patient's insurance., Disp: 1 each, Rfl: 0   Lancets Misc. MISC, 1 each by Does not apply route in the morning, at noon, and at bedtime. May substitute to any manufacturer covered by patient's insurance., Disp: 100 each, Rfl: 0   propranolol (INDERAL) 10 MG tablet, TAKE 1 TABLET(10 MG) BY MOUTH THREE TIMES DAILY AS NEEDED, Disp: 270 tablet, Rfl: 0   valACYclovir (VALTREX) 500 MG tablet, Take by mouth., Disp: , Rfl:  Medication Side Effects: none  Family Medical/ Social History: Changes? No  MENTAL HEALTH EXAM:  There were no vitals taken for this visit.There is no height or weight on file to calculate BMI.  General Appearance: Casual, Neat, and Well Groomed  Eye Contact:  Good  Speech:  Clear and Coherent  Volume:  Normal  Mood:  Anxious, Depressed, and Dysphoric  Affect:  Appropriate  Thought Process:  Coherent  Orientation:  Full (Time, Place,  and Person)  Thought Content: Logical   Suicidal Thoughts:  No  Homicidal Thoughts:  No  Memory:  WNL  Judgement:  Good  Insight:  Good  Psychomotor Activity:  Normal  Concentration:  Concentration: Good  Recall:  Good  Fund of Knowledge: Good  Language: Good  Assets:  Desire for Improvement  ADL's:  Intact  Cognition: WNL  Prognosis:  Good    DIAGNOSES:    ICD-10-CM   1. Generalized anxiety disorder  F41.1 lamoTRIgine (LAMICTAL) 150 MG tablet    2. Attention deficit  hyperactivity disorder (ADHD), combined type  F90.2     3. Cyclothymic disorder in remission  F34.0 lamoTRIgine (LAMICTAL) 150 MG tablet    4. Unspecified mood (affective) disorder (HCC)  F39     5. Acute posttraumatic stress disorder  F43.11 hydrOXYzine (ATARAX) 25 MG tablet    6. Attention deficit hyperactivity disorder (ADHD), combined type, moderate  F90.2 dextroamphetamine (DEXTROSTAT) 5 MG tablet      Receiving Psychotherapy: No    RECOMMENDATIONS:   Greater than 50% of 30 min face to face with patient was spent on counseling and coordination of care. We discussed her current level of stability since last visit. Still heavily involved in church. Will be leaving for [place] in October.   Continue Inderal 10 mg three times daily as needed for anxiety or before public speaking event.   Reduce  Lamotrigine to 150 mg daily Continue Dextrostat 5 mg tablet to once daily Continue Hydroxyzine 25 mg tablet three times daily as needed To follow up in April 2025 per pt Reviewed PDMP Reinforced effects of Lamotrigine and may reduce reliability of BC. She is currently on oral BC. LMC within weeks.         Joan Flores, NP

## 2022-12-27 ENCOUNTER — Telehealth: Payer: BC Managed Care – PPO | Admitting: Behavioral Health

## 2022-12-29 ENCOUNTER — Ambulatory Visit: Payer: BC Managed Care – PPO | Admitting: Family Medicine

## 2023-01-05 ENCOUNTER — Telehealth: Payer: Self-pay | Admitting: Family Medicine

## 2023-01-05 ENCOUNTER — Ambulatory Visit: Payer: BC Managed Care – PPO | Admitting: Family Medicine

## 2023-01-05 ENCOUNTER — Encounter: Payer: Self-pay | Admitting: Family Medicine

## 2023-01-05 VITALS — BP 112/72 | HR 74 | Temp 98.0°F | Ht 65.0 in | Wt 154.2 lb

## 2023-01-05 DIAGNOSIS — R1030 Lower abdominal pain, unspecified: Secondary | ICD-10-CM | POA: Diagnosis not present

## 2023-01-05 DIAGNOSIS — R61 Generalized hyperhidrosis: Secondary | ICD-10-CM

## 2023-01-05 DIAGNOSIS — Z23 Encounter for immunization: Secondary | ICD-10-CM

## 2023-01-05 DIAGNOSIS — R42 Dizziness and giddiness: Secondary | ICD-10-CM | POA: Diagnosis not present

## 2023-01-05 LAB — POCT URINALYSIS DIP (MANUAL ENTRY)
Bilirubin, UA: NEGATIVE
Glucose, UA: NEGATIVE mg/dL
Ketones, POC UA: NEGATIVE mg/dL
Nitrite, UA: NEGATIVE
Protein Ur, POC: 100 mg/dL — AB
Spec Grav, UA: >=1.03 — AB (ref 1.010–1.025)
Urobilinogen, UA: 0.2 U/dL
pH, UA: 6 (ref 5.0–8.0)

## 2023-01-05 NOTE — Telephone Encounter (Signed)
Unless she is having any new urinary symptoms I would wait for the culture as her abdominal symptoms have been going on for some time.  Should have the culture back in the next day or 2.  More information on the celiac panel, does she not want to have that blood work or not have that test?

## 2023-01-05 NOTE — Progress Notes (Signed)
Subjective:  Patient ID: Christy Hart, female    DOB: 2000-08-13  Age: 22 y.o. MRN: [id number]  CC:  Chief Complaint  Patient presents with   Hypoglycemia    Pt had some abnormal blood sugars on blood work    Referral    Pt needs referral to allergist to discuss food allergies as there is concern for gluten allergies     HPI Christy Hart presents for follow up, here with mother today.  Will be out if state, then out of country for 6 months - starting 9/30  Episodic lightheadedness Briefly discussed at her visit July 19.  CBC normal at that time ferritin was slightly low at 11, TIBC 516.  Normal percent sat.  Iron 129.  TSH normal.  Did report heavy bleeding with menses previously.  Some improvement with OCPs but chronic heavy menses, lasting 6 to 7 days.  [facility]  Glucose was borderline low at 54.  Option of home monitoring with glucometer.   Episodes of feeling slightly lightheaded with leaning forward, standing up quickly. No room spinning, no palpitations. Resolves in seconds. No syncope. Few times per month, not necessarily associated with menses.  Breakfast - variable, wakes up late, has lunch.  Drinks water throughout the day -  stanley cup 3 per day.  More protein snacks during the day.  Glucose readings at home have been normal - 80-115, lowest 74.   Abdominal Pain: Notes with pizza, certain foods. Sharp pains at times. 2 times per week for past year.  BM daily. No melena/hematochezia. Rare diarrhea, not a regular occurrence.  Notes pain about 20-47min after eating. No heartburn.  No dysuria, no vaginal bleeding. Frequent urination at times, with increased fluids only.  No unexplained weight loss, no fever.  Night sweats 2 times per week - past few months.  No n/v.  Tx: tums, otc upset stomach med.  Currently on menses.    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   Attention deficit hyperactivity disorder (ADHD), combined type, moderate 11/17/2011   Past Medical History:  Diagnosis Date   ADD (attention deficit disorder)    Allergy    Past Surgical History:  Procedure Laterality Date   ADENOIDECTOMY     TONSILLECTOMY AND ADENOIDECTOMY     TYMPANOSTOMY TUBE PLACEMENT     Allergies  Allergen Reactions   Amoxicillin Other (See Comments)    [name], woozy   Azithromycin     Causes the pt to turn grey in color and does not act her normal self, woozy   Ciprofloxacin     Unsure reaction    Prior to Admission medications   Medication Sig Start Date End Date Taking? Authorizing Provider  Blood Glucose Monitoring Suppl DEVI 1 each by Does not apply route in the morning, at noon, and at bedtime. May substitute to any manufacturer covered by patient's insurance. 12/16/22  Yes Shade Flood, MD  buPROPion [facility] SR) 150 MG 12 hr tablet TAKE 1 TABLET BY MOUTH DAILY AFTER BREAKFAST 04/12/21  Yes White, Arlys John A, NP  dextroamphetamine (DEXTROSTAT) 5 MG tablet TAKE 1 TABLET TWICE A DAY WITH BREAKFAST. 12/21/22 01/20/23 Yes White, Watt Climes, NP  drospirenone-ethinyl estradiol (YASMIN,ZARAH,SYEDA) 3-0.03 MG tablet  08/10/17  Yes [provider]  Glucose Blood (BLOOD GLUCOSE TEST STRIPS) STRP 1 each by In Vitro route in the morning, at noon, and at bedtime. May substitute to any manufacturer covered by patient's insurance. 12/16/22 01/15/23 Yes Shade Flood, MD  hydrOXYzine (ATARAX) 25 MG tablet Take 1 tablet (25 mg total) by mouth 3 (three) times daily as needed for anxiety or nausea (sleep). 12/21/22  Yes Joan Flores, NP  lamoTRIgine (LAMICTAL) 150 MG tablet TAKE ONE TABLET BY MOUTH ONCE DAILY 05/14/21  Yes Avelina Laine A, NP  lamoTRIgine  (LAMICTAL) 150 MG tablet Take 1 tablet (150 mg total) by mouth daily. 12/21/22  Yes Joan Flores, NP  Lancet Device MISC 1 each by Does not apply route in the morning, at noon, and at bedtime. May substitute to any manufacturer covered by patient's insurance. 12/16/22 01/15/23 Yes Shade Flood, MD  Lancets Misc. MISC 1 each by Does not apply route in the morning, at noon, and at bedtime. May substitute to any manufacturer covered by patient's insurance. 12/16/22 01/15/23 Yes Shade Flood, MD  valACYclovir (VALTREX) 500 MG tablet Take by mouth. 12/22/20  Yes [provider]  lamoTRIgine (LAMICTAL) 200 MG tablet TAKE 1 TABLET(200 MG) BY MOUTH DAILY Patient not taking: Reported on 01/05/2023 09/16/22   Joan Flores, NP  propranolol (INDERAL) 10 MG tablet TAKE 1 TABLET(10 MG) BY MOUTH THREE TIMES DAILY AS NEEDED Patient not taking: Reported on 01/05/2023 10/07/22   Joan Flores, NP  fluticasone (FLONASE) 50 MCG/ACT nasal spray Place 2 sprays into both nostrils daily. 07/26/18 05/16/20  Doristine Bosworth, MD   Social History   Socioeconomic History   Marital status: Single    Spouse name: n/a   Number of children: 0   Years of education: 12   Highest education level: High school graduate  Occupational History   Occupation: Waitress  Tobacco Use   Smoking status: Never   Smokeless tobacco: Never  Vaping Use   Vaping status: Never Used  Substance and Sexual Activity   Alcohol use: No   Drug use: No   Sexual activity: Yes    Partners: Male    Birth control/protection: Pill  Other Topics Concern   Not on file  Social History Narrative   Lives with both parents and an older brother in the same house.   Social Determinants of Health   Financial Resource Strain: Not on file  Food Insecurity: Not on file  Transportation Needs: Not on file  Physical Activity: Not on file  Stress: Not on file  Social Connections: Unknown (09/10/2021)   Received from [facility]    Social Network    Social Network: Not on file  Intimate Partner Violence: Unknown (08/02/2021)   Received from [facility]   HITS    Physically Hurt: Not on file    Insult or Talk Down To: Not on file    Threaten Physical Harm: Not on file    Scream or Curse: Not on file    Review of Systems   Objective:   Vitals:   01/05/23 0933  BP: 112/72  Pulse: 74  Temp: 98 F (36.7 C)  TempSrc: Temporal  SpO2: 98%  Weight: 154 lb 3.2 oz (69.9 kg)  Height: 5\' 5"  (1.651 m)     Physical Exam Vitals reviewed.  Constitutional:      Appearance: Normal appearance. She is well-developed.  HENT:     Head:  Normocephalic and atraumatic.  Eyes:     Conjunctiva/sclera: Conjunctivae normal.     Pupils: Pupils are equal, round, and reactive to light.  Neck:     Vascular: No carotid bruit.  Cardiovascular:     Rate and Rhythm: Normal rate and regular rhythm.     Heart sounds: Normal heart sounds.  Pulmonary:     Effort: Pulmonary effort is normal.     Breath sounds: Normal breath sounds.  Abdominal:     Palpations: Abdomen is soft. There is no pulsatile mass.     Tenderness: There is abdominal tenderness (minimal suprapubic, currently on menses.).  Musculoskeletal:     Right lower leg: No edema.     Left lower leg: No edema.  Skin:    General: Skin is warm and dry.  Neurological:     Mental Status: She is alert and oriented to person, place, and time.  Psychiatric:        Mood and Affect: Mood normal.        Behavior: Behavior normal.    Results for orders placed or performed in visit on 01/05/23  POCT urinalysis dipstick  Result Value Ref Range   Color, UA yellow yellow   Clarity, UA clear clear   Glucose, UA negative negative mg/dL   Bilirubin, UA negative negative   Ketones, POC UA negative negative mg/dL   Spec Grav, UA >=7.829 (A) 1.010 - 1.025   Blood, UA large (A) negative   pH, UA 6.0 5.0 - 8.0   Protein Ur, POC =100 (A) negative mg/dL    Urobilinogen, UA 0.2 0.2 or 1.0 E.U./dL   Nitrite, UA Negative Negative   Leukocytes, UA Trace (A) Negative     Assessment & Plan:  Christy Hart is a 22 y.o. female . Episodic lightheadedness  -Positional, reassuring CBC at last visit.  Option to add additional iron supplement over-the-counter or iron rich foods given heavy menses previously.  Maintain hydration, 64 ounces of water per day and regular meals.  No true hypoglycemia noted at home.  RTC precautions if persistent symptoms.  Need for influenza vaccination - Plan: Flu vaccine trivalent PF, 6mos and older(Flulaval,Afluria,Fluarix,Fluzone)  Lower abdominal pain - Plan: Ambulatory referral to Gastroenterology, Celiac Panel, POCT urinalysis dipstick Night sweats - Plan: Celiac Panel  -Intermittent symptoms, noted after eating.  Night sweats noted as above.  Food diary recommended and to advise me of certain trigger foods.  Referred to gastroenterology, will start with celiac panel initially, may need imaging further testing.  Further workup/discussion with GI may need to occur in [place] since she will only be in this area for another few weeks.  -Concentrated urine, blood likely from current menses.  Trace leukocyte esterase without nitrite.  Check urine culture.  Unlikely urinary cause.  RTC precautions.  No orders of the defined types were placed in this encounter.  Patient Instructions  Keep track of your fluid intake - at least 64 ounce water per day.  Regular meals throughout the day, and option of iron supplement over the counter or iron rich foods. Follow up if the lightheadedness continues.   I will check a gluten test (celiac panel), and have referred you to gastroenterology.  I am not sure if they will be able to see you prior to leaving for your trip, but if that is the case I would like you to meet with a gastroenterologist in [place].  We can coordinate that care if needed.  Keep a food diary  and let me know what  specific foods cause the issue and avoid those for now. Return to the clinic or go to the nearest emergency room if any of your symptoms worsen or new symptoms occur.     Signed,   Meredith Staggers, MD [facility] 01/05/23 10:22 AM

## 2023-01-05 NOTE — Telephone Encounter (Signed)
Returned the pt call . She states she is not having the Celiac panel done . She is asking when the UC will be back and I explained it usually takes 24 to 48 hours for results . She said she seen her UA results and does not want to go all weekend without being treated ? What do you recommend ?

## 2023-01-05 NOTE — Patient Instructions (Addendum)
Keep track of your fluid intake - at least 64 ounce water per day.  Regular meals throughout the day, and option of iron supplement over the counter or iron rich foods. Follow up if the lightheadedness continues.   I will check a gluten test (celiac panel), and have referred you to gastroenterology.  I am not sure if they will be able to see you prior to leaving for your trip, but if that is the case I would like you to meet with a gastroenterologist in [place].  We can coordinate that care if needed.  Keep a food diary and let me know what specific foods cause the issue and avoid those for now. Return to the clinic or go to the nearest emergency room if any of your symptoms worsen or new symptoms occur.

## 2023-01-05 NOTE — Telephone Encounter (Signed)
Caller name: KASEN FORKEY  On DPR?: Yes  Call back number: [phone] (mobile)  Provider they see: Shade Flood, MD  Reason for call:  Pt was calling about results she received through MyChart from labs that were drawn this morning. She asked if someone could return her call

## 2023-01-06 LAB — URINE CULTURE
MICRO NUMBER:: 15426373
SPECIMEN QUALITY:: ADEQUATE

## 2023-01-06 NOTE — Telephone Encounter (Signed)
Called and LM informing mother we would call with Provider comments as soon as there are results for all testing

## 2023-01-09 ENCOUNTER — Other Ambulatory Visit: Payer: Self-pay | Admitting: Family Medicine

## 2023-01-09 NOTE — Telephone Encounter (Signed)
Sent to Dr.Greene. Please review results

## 2023-01-09 NOTE — Telephone Encounter (Signed)
Patient Name First: Christy Hart Last: Torrisi Gender: Female DOB: April 16, 2001 Age: 22 Y 11 M 10 D Return Phone Number: [phone] (Primary) Address: City/ State/ Zip: [place] [address] Client [facility] Night - C Client Site [facility] - Night Provider Meredith Staggers- MD Contact Type Call Who Is Calling Patient / Member / Family / Caregiver Call Type Triage / Clinical Relationship To Patient Self Return Phone Number [phone] (Primary) Chief Complaint Abdominal Pain Reason for Call Symptomatic / Request for Health Information Initial Comment Caller states she missed the nurse's call. She had urine cultures come back and she would like some medication called in for a UTI. She has abdominal pain but denies severe pain. Translation No Nurse Assessment Nurse: Zulliger, RN, Eileen Stanford Date/Time (Eastern Time): 01/07/2023 9:55:20 AM Confirm and document reason for call. If symptomatic, describe symptoms. ---Caller states she was seen at the doctor on Thursday. Her urine culture came back and is wanting to get a medication prescribed. States she has urinary frequency/pain. Abdominal pain might be related to being on her period. S/S began yesterday. Does the patient have any new or worsening symptoms? ---Yes Will a triage be completed? ---Yes Related visit to physician within the last 2 weeks? ---Yes Does the PT have any chronic conditions? (i.e. diabetes, asthma, this includes High risk factors for pregnancy, etc.) ---No Is the patient pregnant or possibly pregnant? (Ask all females between the ages of 79-55) ---No Is this a behavioral health or substance abuse call? ---No Guidelines Guideline Title Affirmed Question Affirmed Notes Nurse Date/Time Lamount Cohen Time) Urination Pain - Female All other patients with painful urination (Exception: [1] Zulliger, RN, Eileen Stanford 01/07/2023 9:59:21 AM PLEASE NOTE: All  timestamps contained within this report are represented as [place] Standard Time. CONFIDENTIALTY NOTICE: This fax transmission is intended only for the addressee. It contains information that is legally privileged, confidential or otherwise protected from use or disclosure. If you are not the intended recipient, you are strictly prohibited from reviewing, disclosing, copying using or disseminating any of this information or taking any action in reliance on or regarding this information. If you have received this fax in error, please notify us immediately by telephone so that we can arrange for its return to [place]. Phone: [phone], Toll-Free: [phone], Fax: [phone] Page: 2 of 2 Call Id: [id number] Guidelines Guideline Title Affirmed Question Affirmed Notes Nurse Date/Time Lamount Cohen Time) EITHER frequency or urgency AND [2] has on-call doctor.) Disp. Time Lamount Cohen Time) Disposition Final User 01/07/2023 10:03:23 AM See PCP within 24 Hours Yes Zulliger, RN, Eileen Stanford Final Disposition 01/07/2023 10:03:23 AM See PCP within 24 Hours Yes Zulliger, RN, Mertha Finders Disagree/Comply Disagree Caller Understands Yes PreDisposition Did not know what to do Care Advice Given Per Guideline SEE PCP WITHIN 24 HOURS: * IF OFFICE WILL BE CLOSED: You need to be seen within the next 24 hours. A clinic or an urgent care center is often a good source of care if your doctor's office is closed or you can't get an appointment. CALL BACK IF: * Fever or back pain occurs * You become worse CARE ADVICE given per Urination Pain - Female (Adult) guideline. Comments User: Aletha Halim, RN Date/Time Lamount Cohen Time): 01/07/2023 9:59:15 AM pain 5/10 with urination User: Aletha Halim, RN Date/Time Lamount Cohen Time): 01/07/2023 10:04:16 AM States she will call office on Monday or call back w/ any worsening s/s. Referrals GO TO FACILITY REFUSED  Patient Name First: Christy Hart Last: Dobek Gender: Female DOB:  08-25-2000 Age:  18 Y 11 M 10 D Return Phone Number: [phone] (Primary) Address: City/ State/ Zip: [place] [address] Client [facility] Night - C Client Site [facility] - Night Provider Meredith Staggers- MD Contact Type Call Who Is Calling Patient / Member / Family / Caregiver Call Type Triage / Clinical Relationship To Patient Self Return Phone Number [phone] (Primary) Chief Complaint Abdominal Pain Reason for Call Symptomatic / Request for Health Information Initial Comment Caller states hr test results are posted. The Dr. thought she had a UTI, she is asking for antibiotics. She is having abdominal pain and burning urination. Translation No Disp. Time Lamount Cohen Time) Disposition Final User 01/07/2023 8:53:42 AM Attempt made - message left Luana Shu RN, Carlee 01/07/2023 9:06:35 AM Attempt made - message left Luana Shu RN, Carlee 01/07/2023 9:33:13 AM FINAL ATTEMPT MADE - message left Yes Luana Shu RN, Carlee Final Disposition 01/07/2023 9:33:13 AM FINAL ATTEMPT MADE - message left Yes Luana Shu, RN, [place]leee  Will contact pt ASAP

## 2023-01-09 NOTE — Progress Notes (Signed)
See phone note. Culture results reviewed.  10,000-49,000 CFU of Streptococcus agalactiae.   Called patient - felt slight irritation prior to urination, not during urination and thinks that was due to use of tampon. No fever or abd pain. Resolved yesterday - no difficulty with urination, urgency, dysuria, abd pain currently.   Clarified allergy to amoxicillin, no rash or hives. Nausea, woozy, pale - thinks happened when she was younger.   On review of culture, CFU of 10k-49k, not above 100k and without symptoms at this time will continue to monitor for symptoms prior to treating. Patient advised. She is ok with this plan and will let me know if any symptoms.

## 2023-01-09 NOTE — Telephone Encounter (Signed)
See other note- asymptomatic and with CFu of under 100k on culture will hold on treatment at this time. If symptomatic, she will call. Plan discussed with patient.

## 2023-01-16 ENCOUNTER — Other Ambulatory Visit: Payer: Self-pay | Admitting: Family Medicine

## 2023-03-15 ENCOUNTER — Encounter: Payer: Self-pay | Admitting: Psychiatry

## 2023-03-29 ENCOUNTER — Other Ambulatory Visit: Payer: Self-pay

## 2023-03-29 ENCOUNTER — Telehealth: Payer: Self-pay | Admitting: Behavioral Health

## 2023-03-29 ENCOUNTER — Other Ambulatory Visit: Payer: Self-pay | Admitting: Behavioral Health

## 2023-03-29 DIAGNOSIS — F411 Generalized anxiety disorder: Secondary | ICD-10-CM

## 2023-03-29 DIAGNOSIS — F340 Cyclothymic disorder: Secondary | ICD-10-CM

## 2023-03-29 MED ORDER — LAMOTRIGINE 150 MG PO TABS: 150.0000 mg | ORAL_TABLET | Freq: Every day | ORAL | 1 refills | Status: DC

## 2023-03-29 NOTE — Telephone Encounter (Signed)
Christy Hart called yesterday at 5:57 and 6:03 and LM that she is in [place] and is trying to get her lamictal filled, but the pharmacy there needs a prescription.  Next appt 08/11/23.  Send prescription to [facility] at [address] [place], [place], [address],  Phone [phone]

## 2023-03-29 NOTE — Telephone Encounter (Signed)
SENT LAMICTAL TO REQUESTED PHARMACY.

## 2023-04-02 NOTE — Telephone Encounter (Signed)
Is patient there temporarily?

## 2023-04-03 NOTE — Telephone Encounter (Signed)
Patient reports she was able to get the Lamictal filled.

## 2023-07-20 ENCOUNTER — Ambulatory Visit: Payer: BC Managed Care – PPO | Admitting: Behavioral Health

## 2023-07-31 ENCOUNTER — Encounter: Payer: Self-pay | Admitting: Behavioral Health

## 2023-07-31 ENCOUNTER — Ambulatory Visit (INDEPENDENT_AMBULATORY_CARE_PROVIDER_SITE_OTHER): Admitting: Behavioral Health

## 2023-07-31 VITALS — BP 107/68 | HR 72 | Wt 152.0 lb

## 2023-07-31 DIAGNOSIS — F411 Generalized anxiety disorder: Secondary | ICD-10-CM | POA: Diagnosis not present

## 2023-07-31 DIAGNOSIS — F340 Cyclothymic disorder: Secondary | ICD-10-CM | POA: Diagnosis not present

## 2023-07-31 DIAGNOSIS — F902 Attention-deficit hyperactivity disorder, combined type: Secondary | ICD-10-CM | POA: Diagnosis not present

## 2023-07-31 DIAGNOSIS — F4311 Post-traumatic stress disorder, acute: Secondary | ICD-10-CM

## 2023-07-31 MED ORDER — LAMOTRIGINE 150 MG PO TABS: 150.0000 mg | ORAL_TABLET | Freq: Every day | ORAL | 1 refills | Status: AC

## 2023-07-31 MED ORDER — DEXTROAMPHETAMINE SULFATE 5 MG PO TABS: ORAL_TABLET | ORAL | 0 refills | Status: DC

## 2023-07-31 MED ORDER — HYDROXYZINE HCL 25 MG PO TABS
25.0000 mg | ORAL_TABLET | Freq: Three times a day (TID) | ORAL | 1 refills | Status: AC | PRN
Start: 2023-07-31 — End: ?

## 2023-07-31 NOTE — Progress Notes (Signed)
 [facility]  Patient ID: Christy Hart,  MRN: [id number]  PCP: Shade Flood, MD  Date of Evaluation: 07/31/2023 Time spent:30 minutes  Chief Complaint:  Chief Complaint   Depression; Anxiety; ADHD; Follow-up; Medication Refill; Patient Education     HISTORY/CURRENT STATUS: HPI  Christy Hart presents for follow-up and medication refills.  Says she is doing well with depression and anxiety. She has been traveling with missions and going back to [place] this month. Noticing mood swings two weeks before her period. She has follow up with OB on Wednesday. Period late 22 days but says she has not been sexually active.  Says her anxiety is 2/10 and depression is 2/10. She is sleeping 7-8 hours per night.  Says she will resume her dextrostat because she is having to read more and cannot retain what she has read.  She denies mania, no psychosis. No SI/HI.   Prior psychiatric medications: Wellbutrin     Individual Medical History/ Review of Systems: Changes? :No   Allergies: Amoxicillin, Azithromycin, and Ciprofloxacin  Current Medications:  Current Outpatient Medications:    Blood Glucose Monitoring Suppl DEVI, 1 each by Does not apply route in the morning, at noon, and at bedtime. May substitute to any manufacturer covered by patient's insurance., Disp: 1 each, Rfl: 0   buPROPion (WELLBUTRIN SR) 150 MG 12 hr tablet, TAKE 1 TABLET BY MOUTH DAILY AFTER BREAKFAST, Disp: 30 tablet, Rfl: 2   dextroamphetamine (DEXTROSTAT) 5 MG tablet, TAKE 1 TABLET TWICE A DAY WITH BREAKFAST., Disp: 30 tablet, Rfl: 0   drospirenone-ethinyl estradiol (YASMIN,ZARAH,SYEDA) 3-0.03 MG tablet, , Disp: , Rfl:    hydrOXYzine (ATARAX) 25 MG tablet, Take 1 tablet (25 mg total) by mouth 3 (three) times daily as needed for anxiety or nausea (sleep)., Disp: 270 tablet, Rfl: 1   lamoTRIgine (LAMICTAL) 150 MG tablet, TAKE ONE TABLET BY MOUTH ONCE DAILY, Disp: 30 tablet, Rfl: 1   lamoTRIgine  (LAMICTAL) 150 MG tablet, Take 1 tablet (150 mg total) by mouth daily., Disp: 90 tablet, Rfl: 1   lamoTRIgine (LAMICTAL) 200 MG tablet, TAKE 1 TABLET(200 MG) BY MOUTH DAILY (Patient not taking: Reported on 01/05/2023), Disp: 30 tablet, Rfl: 2   Lancets (ONETOUCH DELICA PLUS LANCET30G) MISC, USE AS DIRECTED THREE TIMES DAILY, Disp: 100 each, Rfl: 0   ONETOUCH VERIO test strip, CHECK BLOOD SUGAR THREE TIMES DAILY, Disp: 100 strip, Rfl: 0   propranolol (INDERAL) 10 MG tablet, TAKE 1 TABLET(10 MG) BY MOUTH THREE TIMES DAILY AS NEEDED (Patient not taking: Reported on 01/05/2023), Disp: 270 tablet, Rfl: 0   valACYclovir (VALTREX) 500 MG tablet, Take by mouth., Disp: , Rfl:  Medication Side Effects: none  Family Medical/ Social History: Changes? No  MENTAL HEALTH EXAM:  There were no vitals taken for this visit.There is no height or weight on file to calculate BMI.  General Appearance: Casual, Neat, and Well Groomed  Eye Contact:  Good  Speech:  Clear and Coherent  Volume:  Normal  Mood:  NA  Affect:  Appropriate  Thought Process:  Coherent  Orientation:  Full (Time, Place, and Person)  Thought Content: Logical   Suicidal Thoughts:  No  Homicidal Thoughts:  No  Memory:  WNL  Judgement:  Good  Insight:  Good  Psychomotor Activity:  Normal  Concentration:  Concentration: Good  Recall:  Good  Fund of Knowledge: Good  Language: Good  Assets:  Desire for Improvement  ADL's:  Intact  Cognition: WNL  Prognosis:  Good    DIAGNOSES:    ICD-10-CM   1. Generalized anxiety disorder  F41.1 lamoTRIgine (LAMICTAL) 150 MG tablet    2. Cyclothymic disorder in remission  F34.0 lamoTRIgine (LAMICTAL) 150 MG tablet    3. Acute posttraumatic stress disorder  F43.11 hydrOXYzine (ATARAX) 25 MG tablet    4. Attention deficit hyperactivity disorder (ADHD), combined type, moderate  F90.2 dextroamphetamine (DEXTROSTAT) 5 MG tablet      Receiving Psychotherapy: No    RECOMMENDATIONS:   Greater than  50% of 30 min face to face with patient was spent on counseling and coordination of care. We discussed her current level of stability since last visit. Still heavily involved in church. Heading back to [place] on 4/7.  Not using Inderal Continue  Lamotrigine to 150 mg daily Continue Dextrostat 5 mg tablet to once daily Continue Hydroxyzine 25 mg tablet three times daily as needed To follow up in 3 months when she returns Reviewed PDMP Reinforced effects of Lamotrigine and may reduce reliability of BC. She is currently on oral BC. LMC late 22 days. No sexual activity reported    Joan Flores, NP

## 2023-08-11 ENCOUNTER — Ambulatory Visit: Payer: BC Managed Care – PPO | Admitting: Behavioral Health

## 2023-08-30 ENCOUNTER — Other Ambulatory Visit: Payer: Self-pay

## 2023-08-30 ENCOUNTER — Telehealth: Payer: Self-pay | Admitting: Behavioral Health

## 2023-08-30 DIAGNOSIS — F902 Attention-deficit hyperactivity disorder, combined type: Secondary | ICD-10-CM

## 2023-08-30 MED ORDER — DEXTROAMPHETAMINE SULFATE 5 MG PO TABS
ORAL_TABLET | ORAL | 0 refills | Status: AC
Start: 2023-08-30 — End: 2023-09-29

## 2023-08-30 NOTE — Telephone Encounter (Signed)
 Pt's mom called at 2:35p.  She is on DPR.  She said pt is in [place]  for school.  She takes Dextrostat  1.5 to 2 pills a day when needed.  She doesn't normally take it on the weekends.  She has been "taking it fine and not having headaches and is able to eat".  She would like a refill but it has to be refilled here and then mom will have to mail it to her due to [place]  requiring that the refill has to come from a doctor in [place] .   Mom says she has enough medicine she thinks till next Thurs and she thinks it will take 5 days or so to get it to her in the mail.  Next appt 7/14

## 2023-08-30 NOTE — Telephone Encounter (Signed)
 Pended Dextrostat  to [facility].

## 2023-10-09 ENCOUNTER — Telehealth: Payer: Self-pay | Admitting: Behavioral Health

## 2023-10-09 NOTE — Telephone Encounter (Signed)
 Duplicate message was sent in error. . Disregard this message

## 2023-10-12 ENCOUNTER — Other Ambulatory Visit: Payer: Self-pay

## 2023-10-12 DIAGNOSIS — F902 Attention-deficit hyperactivity disorder, combined type: Secondary | ICD-10-CM

## 2023-10-12 NOTE — Telephone Encounter (Signed)
 PT's mother called with second message about refilling Dextrostat  for [facility]. Christy Hart is away at school in [place]  and her parents need to fill it at [facility] and ship it to her. Mother said she called two days ago. Please send in the Dextrostat  for them to [facility].

## 2023-10-12 NOTE — Telephone Encounter (Signed)
 Continue Dextrostat  5 mg tablet to once daily   Note says 5 mg once daily, but Rx says BID. Rx are being sent for #30.    She is in school in [place]. Mom is having to mail RF. Can we send in a 90-day supply to local pharmacy.

## 2023-10-13 ENCOUNTER — Other Ambulatory Visit: Payer: Self-pay | Admitting: Behavioral Health

## 2023-10-13 ENCOUNTER — Other Ambulatory Visit: Payer: Self-pay

## 2023-10-13 DIAGNOSIS — F902 Attention-deficit hyperactivity disorder, combined type: Secondary | ICD-10-CM

## 2023-10-13 MED ORDER — DEXTROAMPHETAMINE SULFATE 5 MG PO TABS: ORAL_TABLET | ORAL | 0 refills | Status: DC

## 2023-10-13 NOTE — Telephone Encounter (Signed)
 I changed the RX to read right and resent #60.

## 2023-10-13 NOTE — Telephone Encounter (Signed)
 Yes, We can send under the circumstance. Pend and I will approve. Confirm amount

## 2023-10-13 NOTE — Telephone Encounter (Signed)
 Repended to [facility].

## 2023-11-13 ENCOUNTER — Ambulatory Visit: Admitting: Behavioral Health

## 2023-11-13 ENCOUNTER — Encounter: Payer: Self-pay | Admitting: Behavioral Health

## 2023-11-13 NOTE — Progress Notes (Signed)
 I believe Pt is still in [place]  on missions. No charge this time

## 2024-01-24 ENCOUNTER — Other Ambulatory Visit: Payer: Self-pay

## 2024-01-24 ENCOUNTER — Telehealth: Payer: Self-pay | Admitting: Behavioral Health

## 2024-01-24 DIAGNOSIS — F902 Attention-deficit hyperactivity disorder, combined type: Secondary | ICD-10-CM

## 2024-01-24 MED ORDER — DEXTROAMPHETAMINE SULFATE 5 MG PO TABS: ORAL_TABLET | ORAL | 0 refills | Status: DC

## 2024-01-24 NOTE — Telephone Encounter (Signed)
 Pended

## 2024-01-24 NOTE — Telephone Encounter (Signed)
 Mom called and made an appt for 11/24 when Anilah is home from college. She needs her dextrostat  5 mg sent to [facility]. If you have any questions please call terry her mom at [phone]

## 2024-03-25 ENCOUNTER — Ambulatory Visit: Admitting: Behavioral Health

## 2024-03-26 ENCOUNTER — Telehealth: Admitting: Behavioral Health

## 2024-03-26 ENCOUNTER — Encounter: Payer: Self-pay | Admitting: Behavioral Health

## 2024-03-26 DIAGNOSIS — F4311 Post-traumatic stress disorder, acute: Secondary | ICD-10-CM | POA: Diagnosis not present

## 2024-03-26 DIAGNOSIS — F902 Attention-deficit hyperactivity disorder, combined type: Secondary | ICD-10-CM | POA: Diagnosis not present

## 2024-03-26 MED ORDER — DEXTROAMPHETAMINE SULFATE 5 MG PO TABS: ORAL_TABLET | ORAL | 0 refills | Status: AC

## 2024-03-26 NOTE — Progress Notes (Signed)
 Christy Hart [id number] 2000-12-12 23 y.o.  Virtual Visit via Video Note  I connected with pt @ on 03/26/24 at  2:45 PM EST by a video enabled telemedicine application and verified that I am speaking with the correct person using two identifiers.   I discussed the limitations of evaluation and management by telemedicine and the availability of in person appointments. The patient expressed understanding and agreed to proceed.  I discussed the assessment and treatment plan with the patient. The patient was provided an opportunity to ask questions and all were answered. The patient agreed with the plan and demonstrated an understanding of the instructions.   The patient was advised to call back or seek an in-person evaluation if the symptoms worsen or if the condition fails to improve as anticipated.  I provided 20 minutes of non-face-to-face time during this encounter.  The patient was located at home.  The provider was located at [facility].   Redell DELENA Pizza, NP   Subjective:   Patient ID:  Christy Hart is a 23 y.o. (DOB 2000-08-08) female.  Chief Complaint:  Chief Complaint  Patient presents with   Depression   ADHD   Anxiety   Follow-up   Medication Refill   Patient Education    HPI [name] presents via video visit for follow-up and medication refills.  Says she is doing well with depression and anxiety.  Says that she is over the holidays and will be leaving to go back to [place]  to finish up counseling program with her mission program. She will return in December, and then will be leaving for [place]. Her  anxiety is 2/10 and depression is 2/10. She is sleeping 7-8 hours per night.  She will continue her Dextrostat  and hydroxyzine .  She is using appropriately with no increased additional risk.  She denies mania, no psychosis. No SI/HI.   Prior psychiatric medications: Wellbutrin      Review of Systems:  Review of Systems  Constitutional:  Negative.   Allergic/Immunologic: Negative.   Neurological: Negative.     Medications: I have reviewed the patient's current medications.  Current Outpatient Medications  Medication Sig Dispense Refill   Blood Glucose Monitoring Suppl DEVI 1 each by Does not apply route in the morning, at noon, and at bedtime. May substitute to any manufacturer covered by patient's insurance. 1 each 0   buPROPion  (WELLBUTRIN  SR) 150 MG 12 hr tablet TAKE 1 TABLET BY MOUTH DAILY AFTER BREAKFAST 30 tablet 2   dextroamphetamine  (DEXTROSTAT ) 5 MG tablet Take one tablet by mouth twice daily 60 tablet 0   drospirenone-ethinyl estradiol (YASMIN,ZARAH,SYEDA) 3-0.03 MG tablet      hydrOXYzine  (ATARAX ) 25 MG tablet Take 1 tablet (25 mg total) by mouth 3 (three) times daily as needed for anxiety or nausea (sleep). 270 tablet 1   lamoTRIgine  (LAMICTAL ) 150 MG tablet TAKE ONE TABLET BY MOUTH ONCE DAILY 30 tablet 1   lamoTRIgine  (LAMICTAL ) 150 MG tablet Take 1 tablet (150 mg total) by mouth daily. 90 tablet 1   lamoTRIgine  (LAMICTAL ) 200 MG tablet TAKE 1 TABLET(200 MG) BY MOUTH DAILY (Patient not taking: Reported on 01/05/2023) 30 tablet 2   Lancets (ONETOUCH DELICA PLUS LANCET30G) MISC USE AS DIRECTED THREE TIMES DAILY 100 each 0   ONETOUCH VERIO test strip CHECK BLOOD SUGAR THREE TIMES DAILY 100 strip 0   propranolol  (INDERAL ) 10 MG tablet TAKE 1 TABLET(10 MG) BY MOUTH THREE TIMES DAILY AS NEEDED (Patient not taking: Reported on 01/05/2023) 270  tablet 0   valACYclovir  (VALTREX ) 500 MG tablet Take by mouth.     No current facility-administered medications for this visit.    Medication Side Effects: None  Allergies:  Allergies  Allergen Reactions   Amoxicillin Other (See Comments)    Turns chalk Sameer Teeple and does not act normal self, woozy   Azithromycin      Causes the pt to turn grey in color and does not act her normal self, woozy   Ciprofloxacin      Unsure reaction     Past Medical History:  Diagnosis Date   ADD  (attention deficit disorder)    Allergy     Family History  Problem Relation Age of Onset   Hashimoto's thyroiditis Mother    Stroke Maternal Grandmother 72       +TOBACCO (2 ppd smoker)    Social History   Socioeconomic History   Marital status: Single    Spouse name: n/a   Number of children: 0   Years of education: 12   Highest education level: High school graduate  Occupational History   Occupation: Waitress  Tobacco Use   Smoking status: Never   Smokeless tobacco: Never  Vaping Use   Vaping status: Never Used  Substance and Sexual Activity   Alcohol use: No   Drug use: No   Sexual activity: Yes    Partners: Male    Birth control/protection: Pill  Other Topics Concern   Not on file  Social History Narrative   Lives with both parents and an older brother in the same house.   Social Drivers of Corporate Investment Banker Strain: Not on file  Food Insecurity: No Food Insecurity (03/26/2023)   Received from [facility]   [facility] Food Insecurity    Worried About Programme Researcher, Broadcasting/film/video in the Last Year: Not on file    Ran Out of Food in the Last Year: Not on file    Within the past 12 months, the food you bought just didn't last and you didn't have money to get more.: Not on file    In the past 12 months, were you worried that your food would run out before you got money to buy more or the food you bought just did not last and you did not have money to get more?: Unrecognized value  Transportation Needs: No Transportation Needs (03/26/2023)   Received from [facility]   [facility] Transportation Needs    Lack of Transportation (Medical): Not on file    Lack of Transportation (Non-Medical): Not on file    Unmet Transportation Needs: Not on file    In the past 12 months, has lack of reliable transportation kept you from medical appointments, meetings, work or from getting to things needed for daily living?: Unrecognized value  Physical Activity: Not  on file  Stress: Not on file  Social Connections: Not on file  Intimate Partner Violence: Not At Risk (03/26/2023)   Received from [facility]   [facility] Interpersonal Safety    In the past 12 months, has anyone including family and friends physically hurt you, talk down to you, threaten you with harm, scream, and/or curse at you?: No    Patient Safety At Risk: Not on file    Fear of Current or Ex-Partner: Not on file    Emotionally Abused: Not on file    Physically Abused: Not on file    Sexually Abused: Not on file  Past Medical History, Surgical history, Social history, and Family history were reviewed and updated as appropriate.   Please see review of systems for further details on the patient's review from today.   Objective:   Physical Exam:  There were no vitals taken for this visit.  Physical Exam Neurological:     Mental Status: She is alert and oriented to person, place, and time.  Psychiatric:        Attention and Perception: Attention and perception normal.        Mood and Affect: Mood normal.        Speech: Speech normal.        Behavior: Behavior normal. Behavior is cooperative.        Cognition and Memory: Cognition and memory normal.        Judgment: Judgment normal.     Comments: Insight intact     Lab Review:     Component Value Date/Time   NA 137 12/15/2022 1049   NA 136 01/31/2017 1004   K 3.6 12/15/2022 1049   CL 100 12/15/2022 1049   CO2 25 12/15/2022 1049   GLUCOSE 54 (L) 12/15/2022 1049   BUN 12 12/15/2022 1049   BUN 9 01/31/2017 1004   CREATININE 0.86 12/15/2022 1049   CREATININE 0.65 08/31/2015 1920   CALCIUM  9.8 12/15/2022 1049   PROT 6.9 01/31/2017 1004   ALBUMIN 4.9 01/31/2017 1004   AST 15 01/31/2017 1004   ALT 7 01/31/2017 1004   ALKPHOS 55 01/31/2017 1004   BILITOT 0.3 01/31/2017 1004   GFRNONAA CANCELED 01/31/2017 1004   GFRAA CANCELED 01/31/2017 1004       Component Value Date/Time   WBC 6.6 12/15/2022 1049    RBC 4.45 12/15/2022 1049   HGB 13.0 12/15/2022 1049   HGB 13.8 01/31/2017 1004   HCT 39.8 12/15/2022 1049   HCT 40.7 01/31/2017 1004   PLT 357.0 12/15/2022 1049   PLT 306 01/31/2017 1004   MCV 89.4 12/15/2022 1049   MCV 84.6 01/22/2018 1437   MCV 89 01/31/2017 1004   MCH 29.4 01/22/2018 1437   MCH 29.3 12/29/2014 0417   MCHC 32.6 12/15/2022 1049   RDW 12.8 12/15/2022 1049   RDW 12.9 01/31/2017 1004   LYMPHSABS 2.0 01/31/2017 1004   MONOABS 0.9 08/11/2012 1630   EOSABS 0.0 01/31/2017 1004   BASOSABS 0.0 01/31/2017 1004    No results found for: POCLITH, LITHIUM   No results found for: PHENYTOIN, PHENOBARB, VALPROATE, CBMZ   .res Assessment: Plan:    RECOMMENDATIONS:    Greater than 50% of 20  min via video visit with patient was spent on counseling and coordination of care. We discussed her current level of stability since last visit.  Anxiety and depression are low-level.  Still heavily involved in church.  Continue Dextrostat  5 mg tablet to once daily Continue Hydroxyzine  25 mg tablet three times daily as needed To follow up in 3 months when she returns Reviewed PDMP Stopped oral BC. [facility] 2 days ago.  No recent sexual activity reported    Redell A. Aly Seidenberg, NP  Martia was seen today for depression, adhd, anxiety, follow-up, medication refill and patient education.  Diagnoses and all orders for this visit:  Acute posttraumatic stress disorder  Attention deficit hyperactivity disorder (ADHD), combined type, moderate -     dextroamphetamine  (DEXTROSTAT ) 5 MG tablet; Take one tablet by mouth twice daily     Please see After Visit Summary for patient specific instructions.  No  future appointments.  No orders of the defined types were placed in this encounter.     -------------------------------
